# Patient Record
Sex: Female | Born: 1945 | Race: White | Hispanic: No | Marital: Married | Smoking: Never smoker
Health system: Southern US, Community
[De-identification: ages and names within clinical notes are randomized; demographics above are authoritative.]

## PROBLEM LIST (undated history)

## (undated) DIAGNOSIS — E7800 Pure hypercholesterolemia, unspecified: Secondary | ICD-10-CM

## (undated) DIAGNOSIS — M810 Age-related osteoporosis without current pathological fracture: Secondary | ICD-10-CM

## (undated) DIAGNOSIS — K219 Gastro-esophageal reflux disease without esophagitis: Secondary | ICD-10-CM

## (undated) HISTORY — DX: Age-related osteoporosis without current pathological fracture: M81.0

## (undated) HISTORY — DX: Gastro-esophageal reflux disease without esophagitis: K21.9

## (undated) HISTORY — DX: Pure hypercholesterolemia, unspecified: E78.00

---

## 1995-10-04 HISTORY — PX: INGUINAL HERNIA REPAIR: SHX194

## 1998-07-14 ENCOUNTER — Ambulatory Visit (HOSPITAL_COMMUNITY): Admission: RE | Admit: 1998-07-14 | Discharge: 1998-07-14 | Payer: Self-pay | Admitting: Obstetrics and Gynecology

## 1998-10-03 HISTORY — PX: CHOLECYSTECTOMY: SHX55

## 1999-02-17 ENCOUNTER — Other Ambulatory Visit: Admission: RE | Admit: 1999-02-17 | Discharge: 1999-02-17 | Payer: Self-pay | Admitting: Obstetrics and Gynecology

## 1999-07-13 ENCOUNTER — Ambulatory Visit (HOSPITAL_COMMUNITY): Admission: RE | Admit: 1999-07-13 | Discharge: 1999-07-13 | Payer: Self-pay | Admitting: Obstetrics and Gynecology

## 1999-07-13 ENCOUNTER — Encounter: Payer: Self-pay | Admitting: Obstetrics and Gynecology

## 1999-07-20 ENCOUNTER — Encounter: Payer: Self-pay | Admitting: Gastroenterology

## 1999-07-20 ENCOUNTER — Ambulatory Visit (HOSPITAL_COMMUNITY): Admission: RE | Admit: 1999-07-20 | Discharge: 1999-07-20 | Payer: Self-pay | Admitting: Gastroenterology

## 1999-07-27 ENCOUNTER — Other Ambulatory Visit: Admission: RE | Admit: 1999-07-27 | Discharge: 1999-07-27 | Payer: Self-pay | Admitting: Gastroenterology

## 1999-08-27 ENCOUNTER — Encounter: Payer: Self-pay | Admitting: Gastroenterology

## 1999-08-27 ENCOUNTER — Ambulatory Visit (HOSPITAL_COMMUNITY): Admission: RE | Admit: 1999-08-27 | Discharge: 1999-08-27 | Payer: Self-pay | Admitting: Gastroenterology

## 1999-09-30 ENCOUNTER — Encounter: Payer: Self-pay | Admitting: Surgery

## 1999-10-01 ENCOUNTER — Encounter: Payer: Self-pay | Admitting: Surgery

## 1999-10-01 ENCOUNTER — Ambulatory Visit (HOSPITAL_COMMUNITY): Admission: RE | Admit: 1999-10-01 | Discharge: 1999-10-02 | Payer: Self-pay | Admitting: Surgery

## 2000-03-14 ENCOUNTER — Other Ambulatory Visit: Admission: RE | Admit: 2000-03-14 | Discharge: 2000-03-14 | Payer: Self-pay | Admitting: Obstetrics and Gynecology

## 2000-07-11 ENCOUNTER — Ambulatory Visit (HOSPITAL_COMMUNITY): Admission: RE | Admit: 2000-07-11 | Discharge: 2000-07-11 | Payer: Self-pay | Admitting: Obstetrics and Gynecology

## 2000-07-11 ENCOUNTER — Encounter: Payer: Self-pay | Admitting: Obstetrics and Gynecology

## 2001-03-21 ENCOUNTER — Other Ambulatory Visit: Admission: RE | Admit: 2001-03-21 | Discharge: 2001-03-21 | Payer: Self-pay | Admitting: Obstetrics and Gynecology

## 2001-07-17 ENCOUNTER — Ambulatory Visit (HOSPITAL_COMMUNITY): Admission: RE | Admit: 2001-07-17 | Discharge: 2001-07-17 | Payer: Self-pay | Admitting: Obstetrics and Gynecology

## 2001-07-17 ENCOUNTER — Encounter: Payer: Self-pay | Admitting: Obstetrics and Gynecology

## 2002-08-20 ENCOUNTER — Ambulatory Visit (HOSPITAL_COMMUNITY): Admission: RE | Admit: 2002-08-20 | Discharge: 2002-08-20 | Payer: Self-pay | Admitting: Obstetrics and Gynecology

## 2002-08-20 ENCOUNTER — Encounter: Payer: Self-pay | Admitting: Obstetrics and Gynecology

## 2002-12-11 ENCOUNTER — Other Ambulatory Visit: Admission: RE | Admit: 2002-12-11 | Discharge: 2002-12-11 | Payer: Self-pay | Admitting: Obstetrics and Gynecology

## 2003-09-02 ENCOUNTER — Ambulatory Visit (HOSPITAL_COMMUNITY): Admission: RE | Admit: 2003-09-02 | Discharge: 2003-09-02 | Payer: Self-pay | Admitting: Obstetrics and Gynecology

## 2003-10-07 ENCOUNTER — Encounter: Admission: RE | Admit: 2003-10-07 | Discharge: 2003-10-07 | Payer: Self-pay | Admitting: Obstetrics and Gynecology

## 2003-12-23 ENCOUNTER — Other Ambulatory Visit: Admission: RE | Admit: 2003-12-23 | Discharge: 2003-12-23 | Payer: Self-pay | Admitting: Obstetrics and Gynecology

## 2004-12-23 ENCOUNTER — Other Ambulatory Visit: Admission: RE | Admit: 2004-12-23 | Discharge: 2004-12-23 | Payer: Self-pay | Admitting: Obstetrics and Gynecology

## 2005-10-03 HISTORY — PX: CATARACT EXTRACTION: SUR2

## 2006-02-01 ENCOUNTER — Encounter: Payer: Self-pay | Admitting: Obstetrics and Gynecology

## 2010-10-24 ENCOUNTER — Encounter: Payer: Self-pay | Admitting: Obstetrics and Gynecology

## 2011-08-31 ENCOUNTER — Other Ambulatory Visit: Payer: Self-pay | Admitting: Obstetrics and Gynecology

## 2013-06-13 ENCOUNTER — Encounter: Payer: Self-pay | Admitting: Gastroenterology

## 2013-10-11 ENCOUNTER — Other Ambulatory Visit: Payer: Self-pay | Admitting: Obstetrics and Gynecology

## 2015-11-03 DIAGNOSIS — Z23 Encounter for immunization: Secondary | ICD-10-CM | POA: Diagnosis not present

## 2015-11-03 DIAGNOSIS — Z9181 History of falling: Secondary | ICD-10-CM | POA: Diagnosis not present

## 2015-11-03 DIAGNOSIS — Z658 Other specified problems related to psychosocial circumstances: Secondary | ICD-10-CM | POA: Diagnosis not present

## 2015-11-03 DIAGNOSIS — E785 Hyperlipidemia, unspecified: Secondary | ICD-10-CM | POA: Diagnosis not present

## 2015-11-03 DIAGNOSIS — Z0000 Encounter for general adult medical examination without abnormal findings: Secondary | ICD-10-CM | POA: Diagnosis not present

## 2015-11-03 DIAGNOSIS — K219 Gastro-esophageal reflux disease without esophagitis: Secondary | ICD-10-CM | POA: Diagnosis not present

## 2015-11-03 DIAGNOSIS — Z79899 Other long term (current) drug therapy: Secondary | ICD-10-CM | POA: Diagnosis not present

## 2015-11-03 DIAGNOSIS — M810 Age-related osteoporosis without current pathological fracture: Secondary | ICD-10-CM | POA: Diagnosis not present

## 2015-11-03 DIAGNOSIS — Z1389 Encounter for screening for other disorder: Secondary | ICD-10-CM | POA: Diagnosis not present

## 2015-11-10 DIAGNOSIS — Z1231 Encounter for screening mammogram for malignant neoplasm of breast: Secondary | ICD-10-CM | POA: Diagnosis not present

## 2015-11-10 DIAGNOSIS — Z01419 Encounter for gynecological examination (general) (routine) without abnormal findings: Secondary | ICD-10-CM | POA: Diagnosis not present

## 2015-12-29 DIAGNOSIS — C4491 Basal cell carcinoma of skin, unspecified: Secondary | ICD-10-CM | POA: Diagnosis not present

## 2016-05-03 DIAGNOSIS — H40013 Open angle with borderline findings, low risk, bilateral: Secondary | ICD-10-CM | POA: Diagnosis not present

## 2016-07-01 DIAGNOSIS — Z23 Encounter for immunization: Secondary | ICD-10-CM | POA: Diagnosis not present

## 2016-11-07 DIAGNOSIS — L820 Inflamed seborrheic keratosis: Secondary | ICD-10-CM | POA: Diagnosis not present

## 2016-11-07 DIAGNOSIS — C44529 Squamous cell carcinoma of skin of other part of trunk: Secondary | ICD-10-CM | POA: Diagnosis not present

## 2016-11-07 DIAGNOSIS — D225 Melanocytic nevi of trunk: Secondary | ICD-10-CM | POA: Diagnosis not present

## 2016-11-07 DIAGNOSIS — D1801 Hemangioma of skin and subcutaneous tissue: Secondary | ICD-10-CM | POA: Diagnosis not present

## 2016-11-08 DIAGNOSIS — Z9181 History of falling: Secondary | ICD-10-CM | POA: Diagnosis not present

## 2016-11-08 DIAGNOSIS — Z23 Encounter for immunization: Secondary | ICD-10-CM | POA: Diagnosis not present

## 2016-11-08 DIAGNOSIS — Z658 Other specified problems related to psychosocial circumstances: Secondary | ICD-10-CM | POA: Diagnosis not present

## 2016-11-08 DIAGNOSIS — Z1389 Encounter for screening for other disorder: Secondary | ICD-10-CM | POA: Diagnosis not present

## 2016-11-08 DIAGNOSIS — Z79899 Other long term (current) drug therapy: Secondary | ICD-10-CM | POA: Diagnosis not present

## 2016-11-08 DIAGNOSIS — M810 Age-related osteoporosis without current pathological fracture: Secondary | ICD-10-CM | POA: Diagnosis not present

## 2016-11-08 DIAGNOSIS — S61219A Laceration without foreign body of unspecified finger without damage to nail, initial encounter: Secondary | ICD-10-CM | POA: Diagnosis not present

## 2016-11-08 DIAGNOSIS — Z0000 Encounter for general adult medical examination without abnormal findings: Secondary | ICD-10-CM | POA: Diagnosis not present

## 2016-11-08 DIAGNOSIS — E7800 Pure hypercholesterolemia, unspecified: Secondary | ICD-10-CM | POA: Diagnosis not present

## 2016-11-08 DIAGNOSIS — E785 Hyperlipidemia, unspecified: Secondary | ICD-10-CM | POA: Diagnosis not present

## 2016-11-08 DIAGNOSIS — K219 Gastro-esophageal reflux disease without esophagitis: Secondary | ICD-10-CM | POA: Diagnosis not present

## 2016-11-14 DIAGNOSIS — Z1231 Encounter for screening mammogram for malignant neoplasm of breast: Secondary | ICD-10-CM | POA: Diagnosis not present

## 2016-11-14 DIAGNOSIS — Z01419 Encounter for gynecological examination (general) (routine) without abnormal findings: Secondary | ICD-10-CM | POA: Diagnosis not present

## 2016-11-14 DIAGNOSIS — N39498 Other specified urinary incontinence: Secondary | ICD-10-CM | POA: Diagnosis not present

## 2016-12-05 DIAGNOSIS — L578 Other skin changes due to chronic exposure to nonionizing radiation: Secondary | ICD-10-CM | POA: Diagnosis not present

## 2016-12-05 DIAGNOSIS — L820 Inflamed seborrheic keratosis: Secondary | ICD-10-CM | POA: Diagnosis not present

## 2016-12-05 DIAGNOSIS — L728 Other follicular cysts of the skin and subcutaneous tissue: Secondary | ICD-10-CM | POA: Diagnosis not present

## 2017-01-23 DIAGNOSIS — L570 Actinic keratosis: Secondary | ICD-10-CM | POA: Diagnosis not present

## 2017-01-23 DIAGNOSIS — L821 Other seborrheic keratosis: Secondary | ICD-10-CM | POA: Diagnosis not present

## 2017-02-21 DIAGNOSIS — H40013 Open angle with borderline findings, low risk, bilateral: Secondary | ICD-10-CM | POA: Diagnosis not present

## 2017-07-25 DIAGNOSIS — Z23 Encounter for immunization: Secondary | ICD-10-CM | POA: Diagnosis not present

## 2017-07-31 DIAGNOSIS — D1801 Hemangioma of skin and subcutaneous tissue: Secondary | ICD-10-CM | POA: Diagnosis not present

## 2017-07-31 DIAGNOSIS — L820 Inflamed seborrheic keratosis: Secondary | ICD-10-CM | POA: Diagnosis not present

## 2017-11-10 DIAGNOSIS — Z658 Other specified problems related to psychosocial circumstances: Secondary | ICD-10-CM | POA: Diagnosis not present

## 2017-11-10 DIAGNOSIS — Z9181 History of falling: Secondary | ICD-10-CM | POA: Diagnosis not present

## 2017-11-10 DIAGNOSIS — Z1339 Encounter for screening examination for other mental health and behavioral disorders: Secondary | ICD-10-CM | POA: Diagnosis not present

## 2017-11-10 DIAGNOSIS — Z0000 Encounter for general adult medical examination without abnormal findings: Secondary | ICD-10-CM | POA: Diagnosis not present

## 2017-11-10 DIAGNOSIS — M810 Age-related osteoporosis without current pathological fracture: Secondary | ICD-10-CM | POA: Diagnosis not present

## 2017-11-10 DIAGNOSIS — Z79899 Other long term (current) drug therapy: Secondary | ICD-10-CM | POA: Diagnosis not present

## 2017-11-10 DIAGNOSIS — E785 Hyperlipidemia, unspecified: Secondary | ICD-10-CM | POA: Diagnosis not present

## 2017-11-10 DIAGNOSIS — Z6826 Body mass index (BMI) 26.0-26.9, adult: Secondary | ICD-10-CM | POA: Diagnosis not present

## 2017-11-10 DIAGNOSIS — Z1331 Encounter for screening for depression: Secondary | ICD-10-CM | POA: Diagnosis not present

## 2017-11-10 DIAGNOSIS — K219 Gastro-esophageal reflux disease without esophagitis: Secondary | ICD-10-CM | POA: Diagnosis not present

## 2017-11-22 DIAGNOSIS — Z1231 Encounter for screening mammogram for malignant neoplasm of breast: Secondary | ICD-10-CM | POA: Diagnosis not present

## 2017-11-22 DIAGNOSIS — Z6826 Body mass index (BMI) 26.0-26.9, adult: Secondary | ICD-10-CM | POA: Diagnosis not present

## 2017-11-22 DIAGNOSIS — Z01419 Encounter for gynecological examination (general) (routine) without abnormal findings: Secondary | ICD-10-CM | POA: Diagnosis not present

## 2017-11-24 ENCOUNTER — Other Ambulatory Visit: Payer: Self-pay | Admitting: Obstetrics and Gynecology

## 2017-11-24 DIAGNOSIS — R928 Other abnormal and inconclusive findings on diagnostic imaging of breast: Secondary | ICD-10-CM

## 2017-11-27 ENCOUNTER — Ambulatory Visit: Payer: Self-pay

## 2017-11-27 ENCOUNTER — Ambulatory Visit
Admission: RE | Admit: 2017-11-27 | Discharge: 2017-11-27 | Disposition: A | Discharge status: Home / Self Care | Payer: PPO | Source: Ambulatory Visit | Attending: Obstetrics and Gynecology | Admitting: Obstetrics and Gynecology

## 2017-11-27 DIAGNOSIS — R928 Other abnormal and inconclusive findings on diagnostic imaging of breast: Secondary | ICD-10-CM

## 2017-11-27 DIAGNOSIS — R922 Inconclusive mammogram: Secondary | ICD-10-CM | POA: Diagnosis not present

## 2017-11-28 DIAGNOSIS — M810 Age-related osteoporosis without current pathological fracture: Secondary | ICD-10-CM | POA: Diagnosis not present

## 2017-12-06 DIAGNOSIS — D225 Melanocytic nevi of trunk: Secondary | ICD-10-CM | POA: Diagnosis not present

## 2017-12-06 DIAGNOSIS — L739 Follicular disorder, unspecified: Secondary | ICD-10-CM | POA: Diagnosis not present

## 2017-12-06 DIAGNOSIS — L578 Other skin changes due to chronic exposure to nonionizing radiation: Secondary | ICD-10-CM | POA: Diagnosis not present

## 2017-12-06 DIAGNOSIS — L820 Inflamed seborrheic keratosis: Secondary | ICD-10-CM | POA: Diagnosis not present

## 2017-12-13 DIAGNOSIS — L309 Dermatitis, unspecified: Secondary | ICD-10-CM | POA: Diagnosis not present

## 2017-12-25 DIAGNOSIS — H40013 Open angle with borderline findings, low risk, bilateral: Secondary | ICD-10-CM | POA: Diagnosis not present

## 2018-05-21 DIAGNOSIS — R0982 Postnasal drip: Secondary | ICD-10-CM | POA: Diagnosis not present

## 2018-05-21 DIAGNOSIS — Z6826 Body mass index (BMI) 26.0-26.9, adult: Secondary | ICD-10-CM | POA: Diagnosis not present

## 2018-06-01 DIAGNOSIS — M810 Age-related osteoporosis without current pathological fracture: Secondary | ICD-10-CM | POA: Diagnosis not present

## 2018-07-04 DIAGNOSIS — Z23 Encounter for immunization: Secondary | ICD-10-CM | POA: Diagnosis not present

## 2018-08-08 DIAGNOSIS — K219 Gastro-esophageal reflux disease without esophagitis: Secondary | ICD-10-CM | POA: Diagnosis not present

## 2018-08-08 DIAGNOSIS — R1310 Dysphagia, unspecified: Secondary | ICD-10-CM | POA: Diagnosis not present

## 2018-08-14 DIAGNOSIS — K2950 Unspecified chronic gastritis without bleeding: Secondary | ICD-10-CM | POA: Diagnosis not present

## 2018-08-14 DIAGNOSIS — Z79899 Other long term (current) drug therapy: Secondary | ICD-10-CM | POA: Diagnosis not present

## 2018-08-14 DIAGNOSIS — K644 Residual hemorrhoidal skin tags: Secondary | ICD-10-CM | POA: Diagnosis not present

## 2018-08-14 DIAGNOSIS — K449 Diaphragmatic hernia without obstruction or gangrene: Secondary | ICD-10-CM | POA: Diagnosis not present

## 2018-08-14 DIAGNOSIS — Z7982 Long term (current) use of aspirin: Secondary | ICD-10-CM | POA: Diagnosis not present

## 2018-08-14 DIAGNOSIS — K2970 Gastritis, unspecified, without bleeding: Secondary | ICD-10-CM | POA: Diagnosis not present

## 2018-08-14 DIAGNOSIS — Z800 Family history of malignant neoplasm of digestive organs: Secondary | ICD-10-CM | POA: Diagnosis not present

## 2018-08-14 DIAGNOSIS — I341 Nonrheumatic mitral (valve) prolapse: Secondary | ICD-10-CM | POA: Diagnosis not present

## 2018-08-14 DIAGNOSIS — K219 Gastro-esophageal reflux disease without esophagitis: Secondary | ICD-10-CM | POA: Diagnosis not present

## 2018-08-14 DIAGNOSIS — R1310 Dysphagia, unspecified: Secondary | ICD-10-CM | POA: Diagnosis not present

## 2018-08-14 DIAGNOSIS — M810 Age-related osteoporosis without current pathological fracture: Secondary | ICD-10-CM | POA: Diagnosis not present

## 2018-08-14 DIAGNOSIS — Z1211 Encounter for screening for malignant neoplasm of colon: Secondary | ICD-10-CM | POA: Diagnosis not present

## 2018-08-14 DIAGNOSIS — K319 Disease of stomach and duodenum, unspecified: Secondary | ICD-10-CM | POA: Diagnosis not present

## 2018-11-05 DIAGNOSIS — H40013 Open angle with borderline findings, low risk, bilateral: Secondary | ICD-10-CM | POA: Diagnosis not present

## 2018-11-13 DIAGNOSIS — K219 Gastro-esophageal reflux disease without esophagitis: Secondary | ICD-10-CM | POA: Diagnosis not present

## 2018-11-13 DIAGNOSIS — Z6826 Body mass index (BMI) 26.0-26.9, adult: Secondary | ICD-10-CM | POA: Diagnosis not present

## 2018-11-13 DIAGNOSIS — E785 Hyperlipidemia, unspecified: Secondary | ICD-10-CM | POA: Diagnosis not present

## 2018-11-13 DIAGNOSIS — Z79899 Other long term (current) drug therapy: Secondary | ICD-10-CM | POA: Diagnosis not present

## 2018-11-13 DIAGNOSIS — Z658 Other specified problems related to psychosocial circumstances: Secondary | ICD-10-CM | POA: Diagnosis not present

## 2018-11-13 DIAGNOSIS — Z0000 Encounter for general adult medical examination without abnormal findings: Secondary | ICD-10-CM | POA: Diagnosis not present

## 2018-11-29 DIAGNOSIS — Z1231 Encounter for screening mammogram for malignant neoplasm of breast: Secondary | ICD-10-CM | POA: Diagnosis not present

## 2019-02-27 IMAGING — MG DIGITAL DIAGNOSTIC UNILATERAL RIGHT MAMMOGRAM WITH TOMO AND CAD
4 series · 4 of 12 positions shown · non-contrast
Comparison: Previous exam(s).

CLINICAL DATA: Right breast asymmetry seen on most recent screening
mammography.

EXAM:
DIGITAL DIAGNOSTIC UNILATERAL RIGHT MAMMOGRAM WITH CAD AND TOMO

[R ML synth-2D]
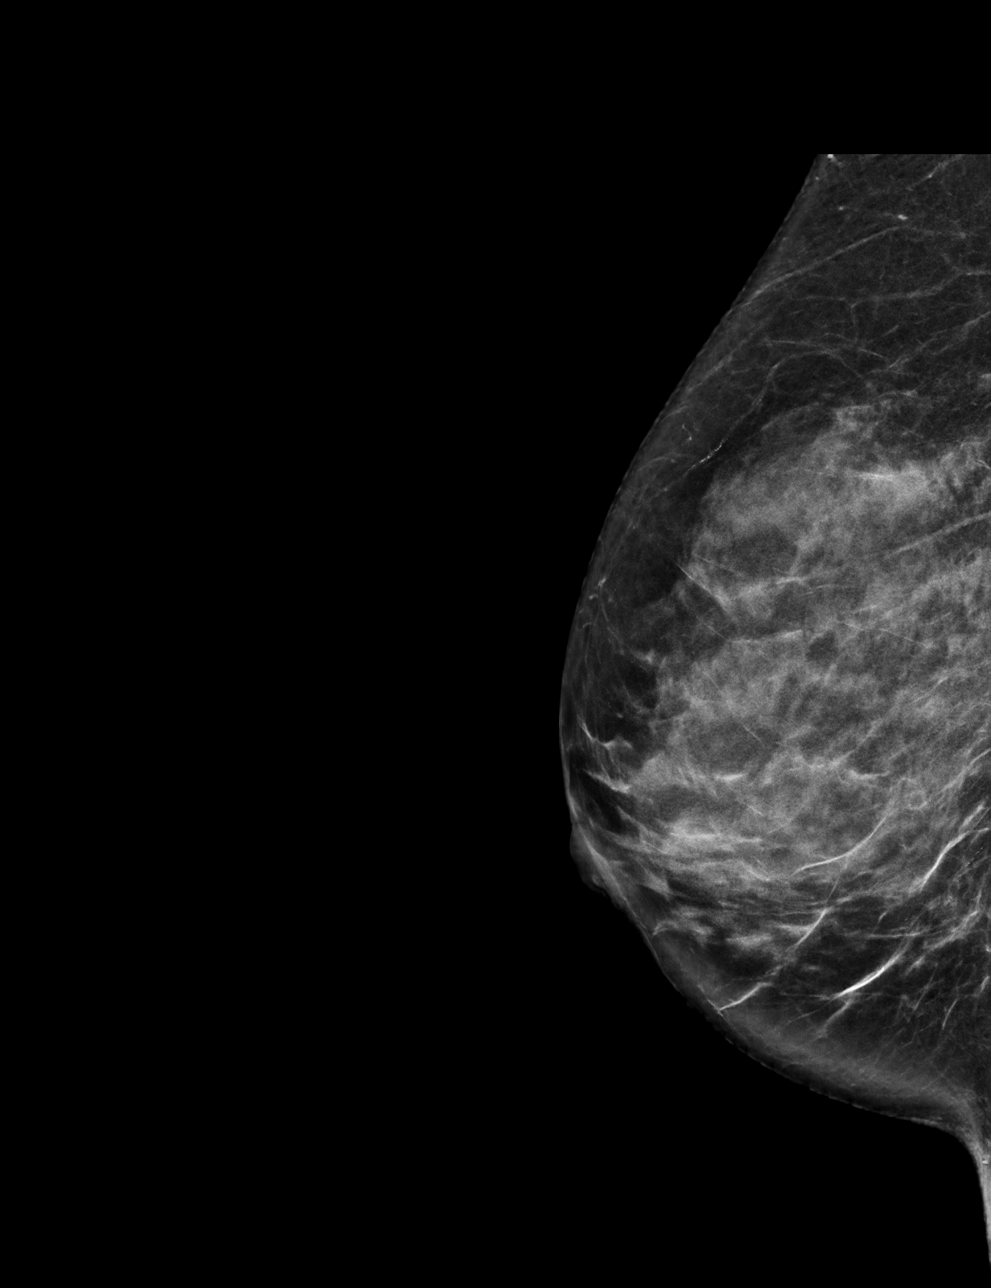

[R CC synth-2D]
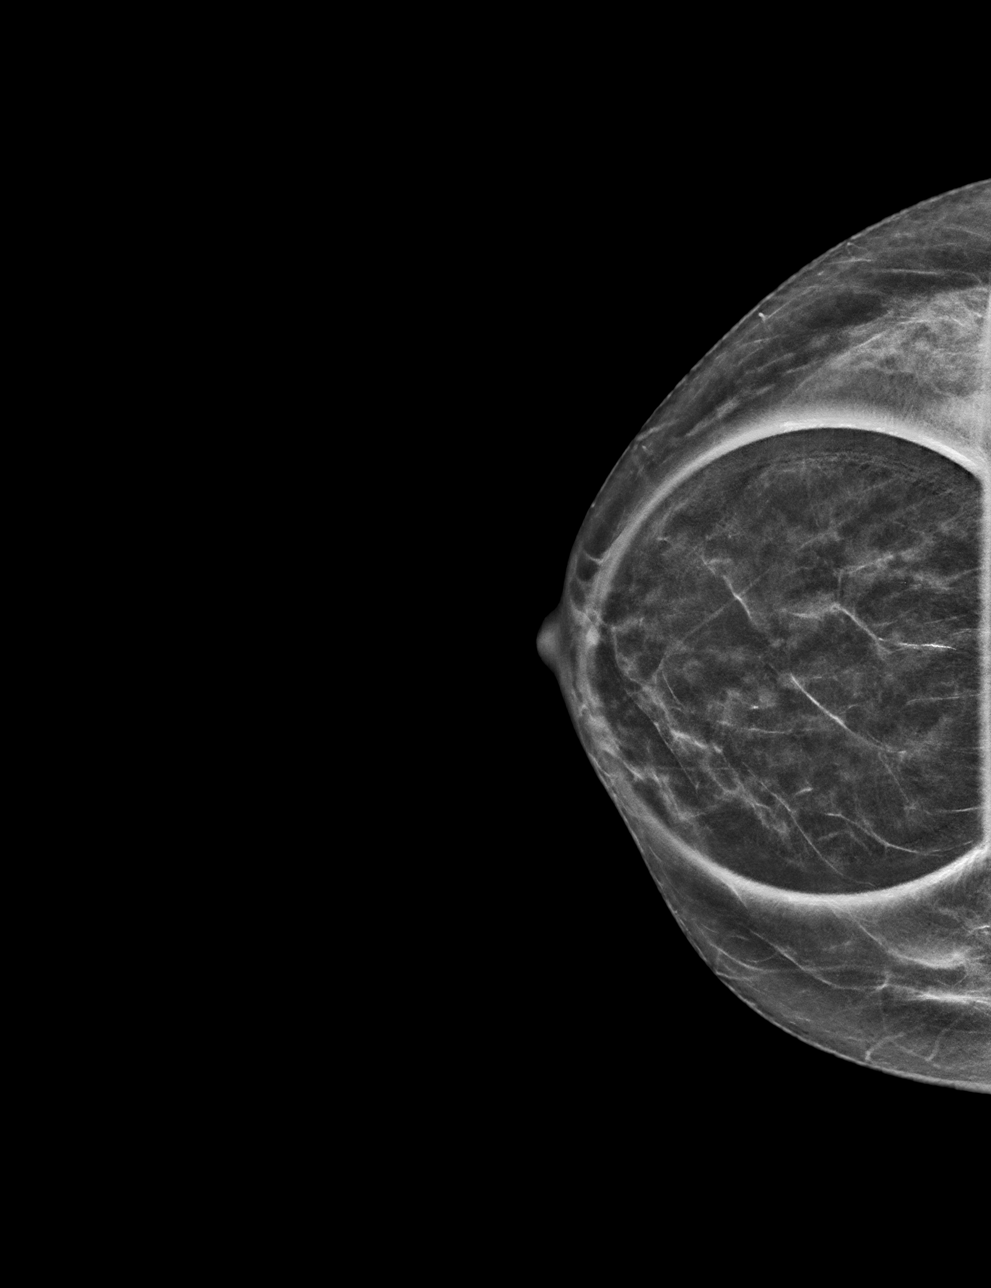

[R ML tomo · tomo slice 33/64.0]
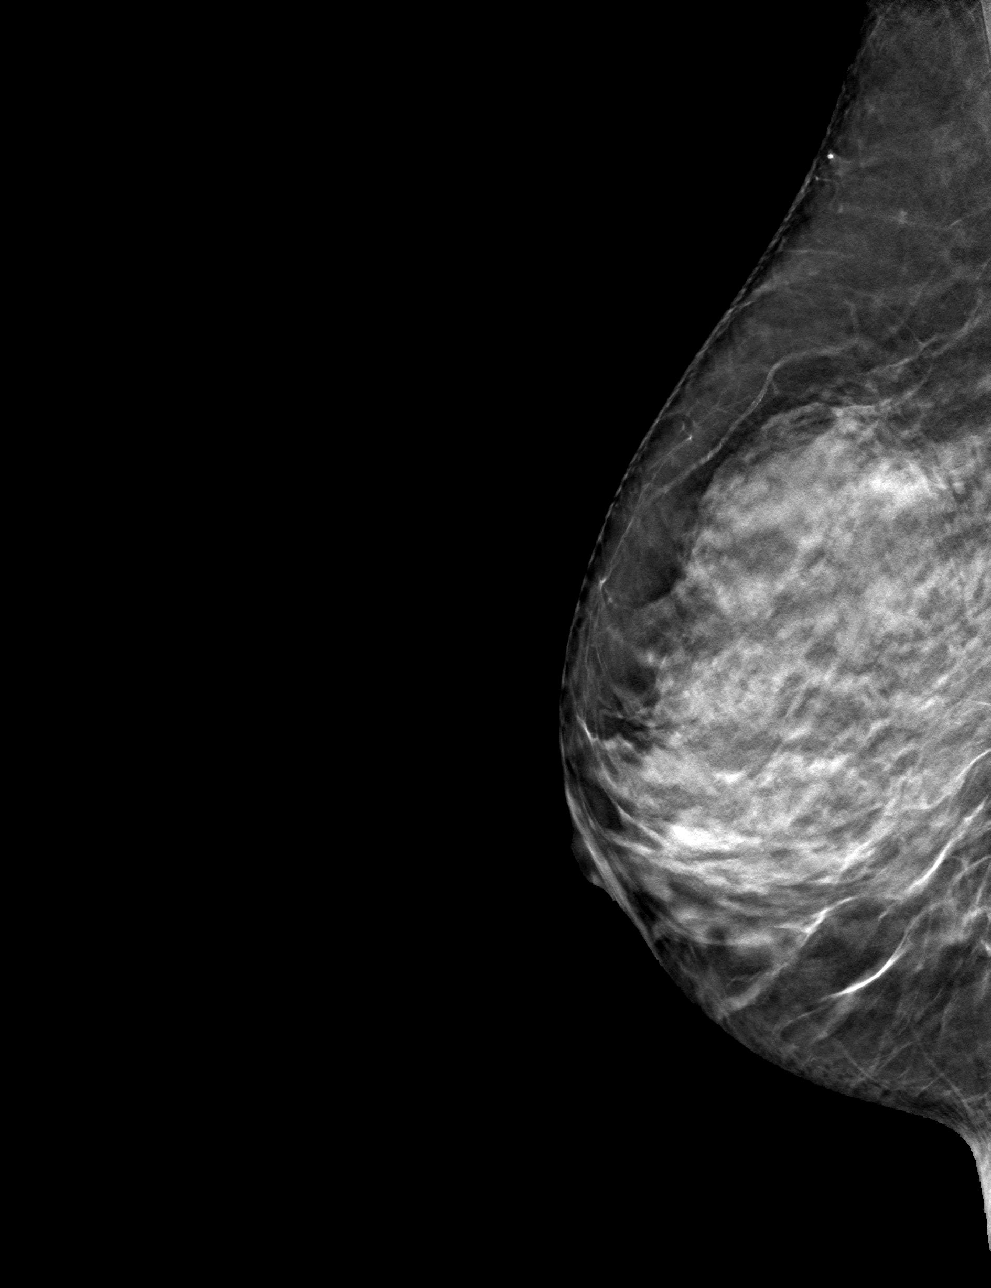

[R CC tomo · tomo slice 25/48.0]
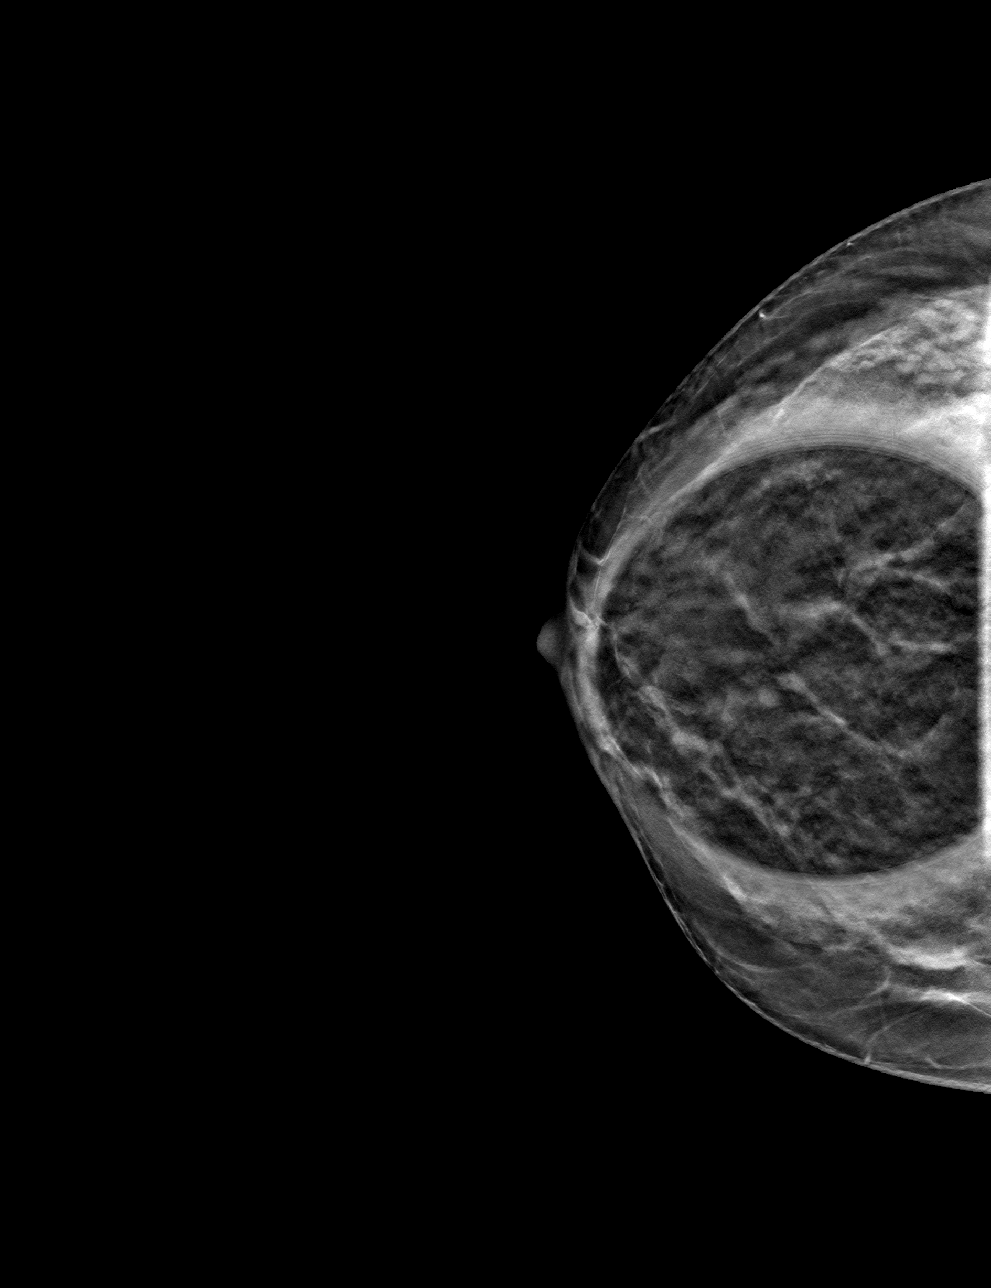

[4 of 12 positions shown; findings below may reference images not displayed]

ACR Breast Density Category c: The breast tissue is heterogeneously
dense, which may obscure small masses.
FINDINGS: Additional mammographic views of the right breast demonstrate no
suspicious masses or areas of architectural distortion. Previously
seen central right breast asymmetry effaces to glandular tissue.

Mammographic images were processed with CAD.
IMPRESSION: No mammographic evidence of malignancy in the right breast.

RECOMMENDATION:
Screening mammogram in one year.(Code:[id number])

I have discussed the findings and recommendations with the patient.
Results were also provided in writing at the conclusion of the
visit. If applicable, a reminder letter will be sent to the patient
regarding the next appointment.

BI-RADS CATEGORY  1: Negative.

## 2019-04-30 DIAGNOSIS — B029 Zoster without complications: Secondary | ICD-10-CM | POA: Diagnosis not present

## 2019-04-30 DIAGNOSIS — L299 Pruritus, unspecified: Secondary | ICD-10-CM | POA: Diagnosis not present

## 2019-04-30 DIAGNOSIS — L309 Dermatitis, unspecified: Secondary | ICD-10-CM | POA: Diagnosis not present

## 2019-05-01 ENCOUNTER — Other Ambulatory Visit: Payer: Self-pay

## 2019-07-16 DIAGNOSIS — Z23 Encounter for immunization: Secondary | ICD-10-CM | POA: Diagnosis not present

## 2019-11-12 DIAGNOSIS — L821 Other seborrheic keratosis: Secondary | ICD-10-CM | POA: Diagnosis not present

## 2019-11-12 DIAGNOSIS — L650 Telogen effluvium: Secondary | ICD-10-CM | POA: Diagnosis not present

## 2019-11-19 DIAGNOSIS — Z658 Other specified problems related to psychosocial circumstances: Secondary | ICD-10-CM | POA: Diagnosis not present

## 2019-11-19 DIAGNOSIS — Z0000 Encounter for general adult medical examination without abnormal findings: Secondary | ICD-10-CM | POA: Diagnosis not present

## 2019-11-19 DIAGNOSIS — Z1331 Encounter for screening for depression: Secondary | ICD-10-CM | POA: Diagnosis not present

## 2019-11-19 DIAGNOSIS — Z9181 History of falling: Secondary | ICD-10-CM | POA: Diagnosis not present

## 2019-11-19 DIAGNOSIS — K219 Gastro-esophageal reflux disease without esophagitis: Secondary | ICD-10-CM | POA: Diagnosis not present

## 2019-11-19 DIAGNOSIS — E7800 Pure hypercholesterolemia, unspecified: Secondary | ICD-10-CM | POA: Diagnosis not present

## 2019-11-19 DIAGNOSIS — Z79899 Other long term (current) drug therapy: Secondary | ICD-10-CM | POA: Diagnosis not present

## 2019-11-19 DIAGNOSIS — Z6827 Body mass index (BMI) 27.0-27.9, adult: Secondary | ICD-10-CM | POA: Diagnosis not present

## 2019-12-26 DIAGNOSIS — H40013 Open angle with borderline findings, low risk, bilateral: Secondary | ICD-10-CM | POA: Diagnosis not present

## 2020-01-14 DIAGNOSIS — M722 Plantar fascial fibromatosis: Secondary | ICD-10-CM

## 2020-01-14 DIAGNOSIS — M6701 Short Achilles tendon (acquired), right ankle: Secondary | ICD-10-CM

## 2020-01-14 DIAGNOSIS — M7731 Calcaneal spur, right foot: Secondary | ICD-10-CM | POA: Diagnosis not present

## 2020-01-14 HISTORY — DX: Plantar fascial fibromatosis: M72.2

## 2020-01-14 HISTORY — DX: Short Achilles tendon (acquired), right ankle: M67.01

## 2020-02-13 DIAGNOSIS — M6701 Short Achilles tendon (acquired), right ankle: Secondary | ICD-10-CM | POA: Diagnosis not present

## 2020-02-13 DIAGNOSIS — M722 Plantar fascial fibromatosis: Secondary | ICD-10-CM | POA: Diagnosis not present

## 2020-03-11 DIAGNOSIS — L820 Inflamed seborrheic keratosis: Secondary | ICD-10-CM | POA: Diagnosis not present

## 2020-03-11 DIAGNOSIS — L650 Telogen effluvium: Secondary | ICD-10-CM | POA: Diagnosis not present

## 2020-07-24 DIAGNOSIS — Z23 Encounter for immunization: Secondary | ICD-10-CM | POA: Diagnosis not present

## 2020-09-10 DIAGNOSIS — L739 Follicular disorder, unspecified: Secondary | ICD-10-CM | POA: Diagnosis not present

## 2020-09-10 DIAGNOSIS — D225 Melanocytic nevi of trunk: Secondary | ICD-10-CM | POA: Diagnosis not present

## 2020-09-10 DIAGNOSIS — L918 Other hypertrophic disorders of the skin: Secondary | ICD-10-CM | POA: Diagnosis not present

## 2020-09-10 DIAGNOSIS — L814 Other melanin hyperpigmentation: Secondary | ICD-10-CM | POA: Diagnosis not present

## 2020-09-15 DIAGNOSIS — Z01419 Encounter for gynecological examination (general) (routine) without abnormal findings: Secondary | ICD-10-CM | POA: Diagnosis not present

## 2020-09-15 DIAGNOSIS — Z1231 Encounter for screening mammogram for malignant neoplasm of breast: Secondary | ICD-10-CM | POA: Diagnosis not present

## 2020-11-23 DIAGNOSIS — Z0000 Encounter for general adult medical examination without abnormal findings: Secondary | ICD-10-CM | POA: Diagnosis not present

## 2020-11-23 DIAGNOSIS — Z79899 Other long term (current) drug therapy: Secondary | ICD-10-CM | POA: Diagnosis not present

## 2020-11-23 DIAGNOSIS — K219 Gastro-esophageal reflux disease without esophagitis: Secondary | ICD-10-CM | POA: Diagnosis not present

## 2020-11-23 DIAGNOSIS — Z9181 History of falling: Secondary | ICD-10-CM | POA: Diagnosis not present

## 2020-11-23 DIAGNOSIS — E7800 Pure hypercholesterolemia, unspecified: Secondary | ICD-10-CM | POA: Diagnosis not present

## 2020-11-23 DIAGNOSIS — Z658 Other specified problems related to psychosocial circumstances: Secondary | ICD-10-CM | POA: Diagnosis not present

## 2020-11-23 DIAGNOSIS — M810 Age-related osteoporosis without current pathological fracture: Secondary | ICD-10-CM | POA: Diagnosis not present

## 2020-11-23 DIAGNOSIS — Z6827 Body mass index (BMI) 27.0-27.9, adult: Secondary | ICD-10-CM | POA: Diagnosis not present

## 2020-11-23 DIAGNOSIS — Z1331 Encounter for screening for depression: Secondary | ICD-10-CM | POA: Diagnosis not present

## 2020-12-07 DIAGNOSIS — H40013 Open angle with borderline findings, low risk, bilateral: Secondary | ICD-10-CM | POA: Diagnosis not present

## 2021-02-05 DIAGNOSIS — H18513 Endothelial corneal dystrophy, bilateral: Secondary | ICD-10-CM | POA: Diagnosis not present

## 2021-02-25 DIAGNOSIS — M810 Age-related osteoporosis without current pathological fracture: Secondary | ICD-10-CM | POA: Diagnosis not present

## 2021-05-14 DIAGNOSIS — Z01818 Encounter for other preprocedural examination: Secondary | ICD-10-CM | POA: Diagnosis not present

## 2021-05-14 DIAGNOSIS — H18513 Endothelial corneal dystrophy, bilateral: Secondary | ICD-10-CM | POA: Diagnosis not present

## 2021-05-31 DIAGNOSIS — Z961 Presence of intraocular lens: Secondary | ICD-10-CM | POA: Diagnosis not present

## 2021-05-31 DIAGNOSIS — H18512 Endothelial corneal dystrophy, left eye: Secondary | ICD-10-CM | POA: Diagnosis not present

## 2021-07-26 DIAGNOSIS — H18511 Endothelial corneal dystrophy, right eye: Secondary | ICD-10-CM | POA: Diagnosis not present

## 2021-07-26 DIAGNOSIS — Z961 Presence of intraocular lens: Secondary | ICD-10-CM | POA: Diagnosis not present

## 2021-08-03 DIAGNOSIS — Z23 Encounter for immunization: Secondary | ICD-10-CM | POA: Diagnosis not present

## 2021-08-31 DIAGNOSIS — M810 Age-related osteoporosis without current pathological fracture: Secondary | ICD-10-CM | POA: Diagnosis not present

## 2021-09-14 DIAGNOSIS — D2239 Melanocytic nevi of other parts of face: Secondary | ICD-10-CM | POA: Diagnosis not present

## 2021-09-14 DIAGNOSIS — L821 Other seborrheic keratosis: Secondary | ICD-10-CM | POA: Diagnosis not present

## 2021-09-14 DIAGNOSIS — L650 Telogen effluvium: Secondary | ICD-10-CM | POA: Diagnosis not present

## 2021-09-14 DIAGNOSIS — D485 Neoplasm of uncertain behavior of skin: Secondary | ICD-10-CM | POA: Diagnosis not present

## 2021-09-14 DIAGNOSIS — D225 Melanocytic nevi of trunk: Secondary | ICD-10-CM | POA: Diagnosis not present

## 2021-11-29 DIAGNOSIS — I341 Nonrheumatic mitral (valve) prolapse: Secondary | ICD-10-CM | POA: Diagnosis not present

## 2021-11-29 DIAGNOSIS — K219 Gastro-esophageal reflux disease without esophagitis: Secondary | ICD-10-CM | POA: Diagnosis not present

## 2021-11-29 DIAGNOSIS — M810 Age-related osteoporosis without current pathological fracture: Secondary | ICD-10-CM | POA: Diagnosis not present

## 2021-11-29 DIAGNOSIS — Z0000 Encounter for general adult medical examination without abnormal findings: Secondary | ICD-10-CM | POA: Diagnosis not present

## 2021-11-29 DIAGNOSIS — E7800 Pure hypercholesterolemia, unspecified: Secondary | ICD-10-CM | POA: Diagnosis not present

## 2021-11-29 DIAGNOSIS — Z1331 Encounter for screening for depression: Secondary | ICD-10-CM | POA: Diagnosis not present

## 2021-11-29 DIAGNOSIS — Z6826 Body mass index (BMI) 26.0-26.9, adult: Secondary | ICD-10-CM | POA: Diagnosis not present

## 2021-11-29 DIAGNOSIS — Z658 Other specified problems related to psychosocial circumstances: Secondary | ICD-10-CM | POA: Diagnosis not present

## 2021-11-29 DIAGNOSIS — Z79899 Other long term (current) drug therapy: Secondary | ICD-10-CM | POA: Diagnosis not present

## 2021-12-01 DIAGNOSIS — S066X0A Traumatic subarachnoid hemorrhage without loss of consciousness, initial encounter: Secondary | ICD-10-CM | POA: Diagnosis not present

## 2021-12-01 DIAGNOSIS — S066X9A Traumatic subarachnoid hemorrhage with loss of consciousness of unspecified duration, initial encounter: Secondary | ICD-10-CM | POA: Diagnosis not present

## 2021-12-01 DIAGNOSIS — R918 Other nonspecific abnormal finding of lung field: Secondary | ICD-10-CM | POA: Diagnosis not present

## 2021-12-01 DIAGNOSIS — Z79899 Other long term (current) drug therapy: Secondary | ICD-10-CM | POA: Diagnosis not present

## 2021-12-01 DIAGNOSIS — W01198A Fall on same level from slipping, tripping and stumbling with subsequent striking against other object, initial encounter: Secondary | ICD-10-CM | POA: Diagnosis not present

## 2021-12-01 DIAGNOSIS — S299XXA Unspecified injury of thorax, initial encounter: Secondary | ICD-10-CM | POA: Diagnosis not present

## 2021-12-01 DIAGNOSIS — M50322 Other cervical disc degeneration at C5-C6 level: Secondary | ICD-10-CM | POA: Diagnosis not present

## 2021-12-01 DIAGNOSIS — K769 Liver disease, unspecified: Secondary | ICD-10-CM | POA: Diagnosis not present

## 2021-12-01 DIAGNOSIS — Z043 Encounter for examination and observation following other accident: Secondary | ICD-10-CM | POA: Diagnosis not present

## 2021-12-01 DIAGNOSIS — R609 Edema, unspecified: Secondary | ICD-10-CM | POA: Diagnosis not present

## 2021-12-01 DIAGNOSIS — N289 Disorder of kidney and ureter, unspecified: Secondary | ICD-10-CM | POA: Diagnosis not present

## 2021-12-01 DIAGNOSIS — M8580 Other specified disorders of bone density and structure, unspecified site: Secondary | ICD-10-CM | POA: Diagnosis not present

## 2021-12-01 DIAGNOSIS — Y999 Unspecified external cause status: Secondary | ICD-10-CM | POA: Diagnosis not present

## 2021-12-01 DIAGNOSIS — S060XAA Concussion with loss of consciousness status unknown, initial encounter: Secondary | ICD-10-CM | POA: Diagnosis not present

## 2021-12-01 DIAGNOSIS — S06360A Traumatic hemorrhage of cerebrum, unspecified, without loss of consciousness, initial encounter: Secondary | ICD-10-CM | POA: Diagnosis not present

## 2021-12-01 DIAGNOSIS — S065X9A Traumatic subdural hemorrhage with loss of consciousness of unspecified duration, initial encounter: Secondary | ICD-10-CM | POA: Diagnosis not present

## 2021-12-01 DIAGNOSIS — S199XXA Unspecified injury of neck, initial encounter: Secondary | ICD-10-CM | POA: Diagnosis not present

## 2021-12-01 DIAGNOSIS — S0232XA Fracture of orbital floor, left side, initial encounter for closed fracture: Secondary | ICD-10-CM | POA: Diagnosis not present

## 2021-12-01 DIAGNOSIS — Z7409 Other reduced mobility: Secondary | ICD-10-CM | POA: Diagnosis not present

## 2021-12-01 DIAGNOSIS — S065XAA Traumatic subdural hemorrhage with loss of consciousness status unknown, initial encounter: Secondary | ICD-10-CM | POA: Diagnosis not present

## 2021-12-01 DIAGNOSIS — S065X0A Traumatic subdural hemorrhage without loss of consciousness, initial encounter: Secondary | ICD-10-CM | POA: Diagnosis not present

## 2021-12-01 DIAGNOSIS — Z7982 Long term (current) use of aspirin: Secondary | ICD-10-CM | POA: Diagnosis not present

## 2021-12-01 DIAGNOSIS — W19XXXA Unspecified fall, initial encounter: Secondary | ICD-10-CM | POA: Diagnosis not present

## 2021-12-01 DIAGNOSIS — S0285XA Fracture of orbit, unspecified, initial encounter for closed fracture: Secondary | ICD-10-CM | POA: Diagnosis not present

## 2021-12-01 DIAGNOSIS — S0240DA Maxillary fracture, left side, initial encounter for closed fracture: Secondary | ICD-10-CM | POA: Diagnosis not present

## 2021-12-02 DIAGNOSIS — S066XAA Traumatic subarachnoid hemorrhage with loss of consciousness status unknown, initial encounter: Secondary | ICD-10-CM | POA: Diagnosis not present

## 2021-12-02 DIAGNOSIS — W19XXXA Unspecified fall, initial encounter: Secondary | ICD-10-CM | POA: Insufficient documentation

## 2021-12-02 DIAGNOSIS — H40052 Ocular hypertension, left eye: Secondary | ICD-10-CM | POA: Diagnosis not present

## 2021-12-02 DIAGNOSIS — S065XAA Traumatic subdural hemorrhage with loss of consciousness status unknown, initial encounter: Secondary | ICD-10-CM | POA: Diagnosis not present

## 2021-12-02 DIAGNOSIS — S0232XA Fracture of orbital floor, left side, initial encounter for closed fracture: Secondary | ICD-10-CM | POA: Diagnosis not present

## 2021-12-02 HISTORY — DX: Unspecified fall, initial encounter: W19.XXXA

## 2021-12-09 DIAGNOSIS — S02401A Maxillary fracture, unspecified, initial encounter for closed fracture: Secondary | ICD-10-CM | POA: Insufficient documentation

## 2021-12-09 DIAGNOSIS — S0240DD Maxillary fracture, left side, subsequent encounter for fracture with routine healing: Secondary | ICD-10-CM | POA: Diagnosis not present

## 2021-12-09 DIAGNOSIS — S0232XD Fracture of orbital floor, left side, subsequent encounter for fracture with routine healing: Secondary | ICD-10-CM | POA: Insufficient documentation

## 2021-12-09 DIAGNOSIS — S0012XA Contusion of left eyelid and periocular area, initial encounter: Secondary | ICD-10-CM | POA: Diagnosis not present

## 2021-12-09 HISTORY — DX: Maxillary fracture, unspecified side, initial encounter for closed fracture: S02.401A

## 2021-12-09 HISTORY — DX: Fracture of orbital floor, left side, subsequent encounter for fracture with routine healing: S02.32XD

## 2021-12-15 DIAGNOSIS — S0232XD Fracture of orbital floor, left side, subsequent encounter for fracture with routine healing: Secondary | ICD-10-CM | POA: Diagnosis not present

## 2021-12-15 DIAGNOSIS — Z6826 Body mass index (BMI) 26.0-26.9, adult: Secondary | ICD-10-CM | POA: Diagnosis not present

## 2021-12-15 DIAGNOSIS — R946 Abnormal results of thyroid function studies: Secondary | ICD-10-CM | POA: Diagnosis not present

## 2021-12-15 DIAGNOSIS — Z8679 Personal history of other diseases of the circulatory system: Secondary | ICD-10-CM | POA: Diagnosis not present

## 2021-12-15 DIAGNOSIS — S0012XD Contusion of left eyelid and periocular area, subsequent encounter: Secondary | ICD-10-CM | POA: Diagnosis not present

## 2021-12-15 DIAGNOSIS — S0219XD Other fracture of base of skull, subsequent encounter for fracture with routine healing: Secondary | ICD-10-CM | POA: Diagnosis not present

## 2021-12-15 DIAGNOSIS — R7989 Other specified abnormal findings of blood chemistry: Secondary | ICD-10-CM | POA: Diagnosis not present

## 2021-12-17 DIAGNOSIS — Z947 Corneal transplant status: Secondary | ICD-10-CM | POA: Diagnosis not present

## 2021-12-20 DIAGNOSIS — I340 Nonrheumatic mitral (valve) insufficiency: Secondary | ICD-10-CM | POA: Diagnosis not present

## 2021-12-20 DIAGNOSIS — I341 Nonrheumatic mitral (valve) prolapse: Secondary | ICD-10-CM | POA: Diagnosis not present

## 2021-12-20 DIAGNOSIS — I361 Nonrheumatic tricuspid (valve) insufficiency: Secondary | ICD-10-CM | POA: Diagnosis not present

## 2022-01-17 DIAGNOSIS — R946 Abnormal results of thyroid function studies: Secondary | ICD-10-CM | POA: Diagnosis not present

## 2022-01-17 DIAGNOSIS — Z947 Corneal transplant status: Secondary | ICD-10-CM | POA: Diagnosis not present

## 2022-01-19 DIAGNOSIS — R946 Abnormal results of thyroid function studies: Secondary | ICD-10-CM | POA: Diagnosis not present

## 2022-01-21 DIAGNOSIS — M8589 Other specified disorders of bone density and structure, multiple sites: Secondary | ICD-10-CM | POA: Diagnosis not present

## 2022-01-21 DIAGNOSIS — K219 Gastro-esophageal reflux disease without esophagitis: Secondary | ICD-10-CM | POA: Diagnosis not present

## 2022-01-21 DIAGNOSIS — E7800 Pure hypercholesterolemia, unspecified: Secondary | ICD-10-CM | POA: Diagnosis not present

## 2022-01-21 DIAGNOSIS — E0590 Thyrotoxicosis, unspecified without thyrotoxic crisis or storm: Secondary | ICD-10-CM | POA: Diagnosis not present

## 2022-03-21 DIAGNOSIS — H16223 Keratoconjunctivitis sicca, not specified as Sjogren's, bilateral: Secondary | ICD-10-CM | POA: Diagnosis not present

## 2022-03-21 DIAGNOSIS — H5213 Myopia, bilateral: Secondary | ICD-10-CM | POA: Diagnosis not present

## 2022-03-21 DIAGNOSIS — Z947 Corneal transplant status: Secondary | ICD-10-CM | POA: Diagnosis not present

## 2022-03-31 DIAGNOSIS — M810 Age-related osteoporosis without current pathological fracture: Secondary | ICD-10-CM | POA: Diagnosis not present

## 2022-05-26 DIAGNOSIS — R42 Dizziness and giddiness: Secondary | ICD-10-CM | POA: Diagnosis not present

## 2022-05-26 DIAGNOSIS — H60541 Acute eczematoid otitis externa, right ear: Secondary | ICD-10-CM | POA: Diagnosis not present

## 2022-05-26 DIAGNOSIS — Z6826 Body mass index (BMI) 26.0-26.9, adult: Secondary | ICD-10-CM | POA: Diagnosis not present

## 2022-06-17 DIAGNOSIS — Z6826 Body mass index (BMI) 26.0-26.9, adult: Secondary | ICD-10-CM | POA: Diagnosis not present

## 2022-06-17 DIAGNOSIS — R1013 Epigastric pain: Secondary | ICD-10-CM | POA: Diagnosis not present

## 2022-06-19 DIAGNOSIS — B029 Zoster without complications: Secondary | ICD-10-CM | POA: Diagnosis not present

## 2022-06-19 DIAGNOSIS — R21 Rash and other nonspecific skin eruption: Secondary | ICD-10-CM | POA: Diagnosis not present

## 2022-07-11 DIAGNOSIS — Z947 Corneal transplant status: Secondary | ICD-10-CM | POA: Diagnosis not present

## 2022-09-15 DIAGNOSIS — L821 Other seborrheic keratosis: Secondary | ICD-10-CM | POA: Diagnosis not present

## 2022-09-15 DIAGNOSIS — D225 Melanocytic nevi of trunk: Secondary | ICD-10-CM | POA: Diagnosis not present

## 2022-09-15 DIAGNOSIS — D2239 Melanocytic nevi of other parts of face: Secondary | ICD-10-CM | POA: Diagnosis not present

## 2022-09-15 DIAGNOSIS — L578 Other skin changes due to chronic exposure to nonionizing radiation: Secondary | ICD-10-CM | POA: Diagnosis not present

## 2022-09-22 DIAGNOSIS — M67442 Ganglion, left hand: Secondary | ICD-10-CM | POA: Diagnosis not present

## 2022-09-23 DIAGNOSIS — Z23 Encounter for immunization: Secondary | ICD-10-CM | POA: Diagnosis not present

## 2022-10-05 DIAGNOSIS — M810 Age-related osteoporosis without current pathological fracture: Secondary | ICD-10-CM | POA: Diagnosis not present

## 2022-10-12 DIAGNOSIS — Z01419 Encounter for gynecological examination (general) (routine) without abnormal findings: Secondary | ICD-10-CM | POA: Diagnosis not present

## 2022-10-12 DIAGNOSIS — Z1231 Encounter for screening mammogram for malignant neoplasm of breast: Secondary | ICD-10-CM | POA: Diagnosis not present

## 2022-12-01 DIAGNOSIS — Z6826 Body mass index (BMI) 26.0-26.9, adult: Secondary | ICD-10-CM | POA: Diagnosis not present

## 2022-12-01 DIAGNOSIS — K219 Gastro-esophageal reflux disease without esophagitis: Secondary | ICD-10-CM | POA: Diagnosis not present

## 2022-12-01 DIAGNOSIS — Z1331 Encounter for screening for depression: Secondary | ICD-10-CM | POA: Diagnosis not present

## 2022-12-01 DIAGNOSIS — Z658 Other specified problems related to psychosocial circumstances: Secondary | ICD-10-CM | POA: Diagnosis not present

## 2022-12-01 DIAGNOSIS — E7800 Pure hypercholesterolemia, unspecified: Secondary | ICD-10-CM | POA: Diagnosis not present

## 2022-12-01 DIAGNOSIS — Z0000 Encounter for general adult medical examination without abnormal findings: Secondary | ICD-10-CM | POA: Diagnosis not present

## 2022-12-01 DIAGNOSIS — M810 Age-related osteoporosis without current pathological fracture: Secondary | ICD-10-CM | POA: Diagnosis not present

## 2022-12-01 DIAGNOSIS — Z79899 Other long term (current) drug therapy: Secondary | ICD-10-CM | POA: Diagnosis not present

## 2022-12-14 ENCOUNTER — Encounter: Payer: Self-pay | Admitting: Cardiology

## 2022-12-14 ENCOUNTER — Encounter: Payer: Self-pay | Admitting: *Deleted

## 2022-12-14 DIAGNOSIS — R32 Unspecified urinary incontinence: Secondary | ICD-10-CM

## 2022-12-14 DIAGNOSIS — Z7409 Other reduced mobility: Secondary | ICD-10-CM | POA: Insufficient documentation

## 2022-12-14 DIAGNOSIS — Z780 Asymptomatic menopausal state: Secondary | ICD-10-CM

## 2022-12-14 DIAGNOSIS — Z789 Other specified health status: Secondary | ICD-10-CM | POA: Insufficient documentation

## 2022-12-14 DIAGNOSIS — C4491 Basal cell carcinoma of skin, unspecified: Secondary | ICD-10-CM

## 2022-12-14 DIAGNOSIS — M8580 Other specified disorders of bone density and structure, unspecified site: Secondary | ICD-10-CM | POA: Insufficient documentation

## 2022-12-14 HISTORY — DX: Basal cell carcinoma of skin, unspecified: C44.91

## 2022-12-14 HISTORY — DX: Other specified health status: Z78.9

## 2022-12-14 HISTORY — DX: Other specified disorders of bone density and structure, unspecified site: M85.80

## 2022-12-14 HISTORY — DX: Asymptomatic menopausal state: Z78.0

## 2022-12-14 HISTORY — DX: Unspecified urinary incontinence: R32

## 2023-01-05 NOTE — Progress Notes (Deleted)
Cardiology Office Note:    Date:  01/05/2023   ID:  Cheryl Harrington, DOB 01/25/1946, MRN [id number]  PCP:  Ronita Hipps, MD  Cardiologist:  Shirlee More, MD   Referring MD: Ronita Hipps, MD  ASSESSMENT:    No diagnosis found. PLAN:    In order of problems listed above:  ***  Next appointment   Medication Adjustments/Labs and Tests Ordered: Current medicines are reviewed at length with the patient today.  Concerns regarding medicines are outlined above.  No orders of the defined types were placed in this encounter.  No orders of the defined types were placed in this encounter.    No chief complaint on file. ***  History of Present Illness:    Cheryl Harrington is a 77 y.o. female with a history of hyperlipidemia she was seen by her PCP 12/01/2022 she had a previous echocardiogram in 2023 showing normal left ventricular function and no other abnormality..  She was continued on lipid-lowering therapy.  She is being seen today for at the request of Ronita Hipps, MD.  She did have CT angiogram of the neck performed 12/02/2021 which showed minimal calcific plaque left carotid bifurcation and right carotid bifurcation.  Past Medical History:  Diagnosis Date   Basal cell carcinoma of skin 12/14/2022   Had basal cell carcinoma excised from right labia in12/2014 and then further treated with additional excision and cryo 10/2012 per Dr. Harrington Challenger note. Path report indicated positive peripheral and deep edges. Colpo for completeness 12/2015 - no bx needed. Per pt and confirmed with exam - area of prior biopsy R anterior labia. No residual skin changes noted.   Closed fracture of left orbital floor with routine healing 12/09/2021   Closed fracture of maxillary sinus (HCC) 12/09/2021   Elevated cholesterol    Fall 12/02/2021   GERD (gastroesophageal reflux disease)    Heel cord tightness, right 01/14/2020   Impaired mobility and ADLs 12/14/2022   Menopause present 12/14/2022    Osteopenia 12/14/2022   dx 2004- Actonel/Fosamax x 28yrs, followed by PCP, last dexa 2017 - per pt PCP has restarted her on bisphosphnate med   Osteoporosis    Plantar fasciitis 01/14/2020   Urinary incontinence 12/14/2022   occasional stress UI- discussed causes and reviewed mgmt options with pt 11/2015- declines intervention at this time, still declining PT etc at 2018 visit.    Past Surgical History:  Procedure Laterality Date   CATARACT EXTRACTION Bilateral 2007   CHOLECYSTECTOMY  2000   INGUINAL HERNIA REPAIR  1997    Current Medications: No outpatient medications have been marked as taking for the 01/06/23 encounter (Appointment) with Richardo Priest, MD.     Allergies:   Patient has no known allergies.   Social History   Socioeconomic History   Marital status: Married    Spouse name: Not on file   Number of children: Not on file   Years of education: Not on file   Highest education level: Not on file  Occupational History   Not on file  Tobacco Use   Smoking status: Never   Smokeless tobacco: Not on file  Substance and Sexual Activity   Alcohol use: Never   Drug use: Not on file   Sexual activity: Not on file  Other Topics Concern   Not on file  Social History Narrative   Not on file   Social Determinants of Health   Financial Resource Strain: Not on file  Food Insecurity: Not  on file  Transportation Needs: Not on file  Physical Activity: Not on file  Stress: Not on file  Social Connections: Not on file     Family History: The patient's ***family history includes Colon cancer in her father; Congestive Heart Failure in her mother; Heart attack in her mother; Multiple myeloma in her mother; Stroke in her maternal grandmother and paternal grandmother.  ROS:   ROS Please see the history of present illness.    *** All other systems reviewed and are negative.  EKGs/Labs/Other Studies Reviewed:    The following studies were reviewed today: ***      EKG:   EKG is *** ordered today.  The ekg ordered today is personally reviewed and demonstrates ***  Recent Labs: No results found for requested labs within last 365 days.  Recent Lipid Panel No results found for: "CHOL", "TRIG", "HDL", "CHOLHDL", "VLDL", "LDLCALC", "LDLDIRECT"  Physical Exam:    VS:  There were no vitals taken for this visit.    Wt Readings from Last 3 Encounters:  12/01/22 152 lb (68.9 kg)     GEN: *** Well nourished, well developed in no acute distress HEENT: Normal NECK: No JVD; No carotid bruits LYMPHATICS: No lymphadenopathy CARDIAC: ***RRR, no murmurs, rubs, gallops RESPIRATORY:  Clear to auscultation without rales, wheezing or rhonchi  ABDOMEN: Soft, non-tender, non-distended MUSCULOSKELETAL:  No edema; No deformity  SKIN: Warm and dry NEUROLOGIC:  Alert and oriented x 3 PSYCHIATRIC:  Normal affect     Signed, Shirlee More, MD  01/05/2023 5:44 PM    [facility]

## 2023-01-06 ENCOUNTER — Ambulatory Visit: Payer: PPO | Admitting: Cardiology

## 2023-01-06 DIAGNOSIS — Z7189 Other specified counseling: Secondary | ICD-10-CM

## 2023-02-01 DIAGNOSIS — E7800 Pure hypercholesterolemia, unspecified: Secondary | ICD-10-CM | POA: Insufficient documentation

## 2023-02-01 DIAGNOSIS — M810 Age-related osteoporosis without current pathological fracture: Secondary | ICD-10-CM | POA: Insufficient documentation

## 2023-02-01 DIAGNOSIS — K219 Gastro-esophageal reflux disease without esophagitis: Secondary | ICD-10-CM | POA: Insufficient documentation

## 2023-02-02 NOTE — Progress Notes (Signed)
ID:  Cheryl Harrington, DOB 12/09/1945, MRN [id number]  PCP:  Marylen Ponto, MD  Cardiologist:  Norman Herrlich, MD   Referring MD: Marylen Ponto, MD  ASSESSMENT:    1. Precordial pain   2. Chest pain of uncertain etiology   3. Exertional shortness of breath   4. Left bundle branch block (LBBB)   5. Shortened PR interval   6. Elevated cholesterol    PLAN:    In order of problems listed above:  Initially this was a visit to assess cardiovascular risk (evolved into evaluation for symptoms chest pain shortness of breath and multiple EKG abnormalities short PR interval without preexcitation and left bundle branch block.  Her echocardiogram a year ago shows a structurally normal heart she does not have cardiomyopathy and not feel need to repeat it.  I do think she needs undergo an ischemia evaluation and after discussion of techniques she chooses CTA which will be scheduled no given his coronary calcium score although in her age group it is less predictive and also the presence or absence of obstructive CAD especially with her family history. I will continue her lipid-lowering therapy  Next appointment 3 months   Medication Adjustments/Labs and Tests Ordered: Current medicines are reviewed at length with the patient today.  Concerns regarding medicines are outlined above.  Orders Placed This Encounter  Procedures   CT CORONARY MORPH W/CTA COR W/SCORE W/CA W/CM &/OR WO/CM   Basic Metabolic Panel (BMET)   EKG 12-Lead   Meds ordered this encounter  Medications   metoprolol tartrate (LOPRESSOR) 100 MG tablet    Sig: Take 1 tablet (100 mg total) by mouth once for 1 dose. Please take this medication 2 hours before CT.    Dispense:  1 tablet    Refill:  0    Chief complaint I am concerned that I have heart disease  History of Present Illness:    Cheryl Harrington is a 77 y.o. female who is being seen today for the evaluation of heart disease at the request of Marylen Ponto,  MD.  Her brother age 37 sustained myocardial infarction unfortunately died during transport to the hospital.  Her mother also died of heart disease.  Since the death of her brother she has been very concerned about her potential. 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 She has no history of congenital or rheumatic heart disease or atrial fibrillation She is a vigorous active woman she has been on long-term statin therapy. In the last year she notices she is breathless with doing activities like gardening work is not severe limiting fluids to do she has also had episodes of burning in the left chest with and without activity. Her EKG in our office today shows left bundle branch block she is a short PR interval but I would not define her EKG as preexcitation She has had no edema orthopnea or exertional chest pain  She had an echocardiogram performed at [facility] 12/20/2021 left ventricular ejection fraction 60 to 65% grade 1 diastolic filling pattern normal left atrial pressure otherwise normal test structurally normal heart no valvular abnormality.   CT 12/18/2022 she had facial trauma and small volume stable subarachnoid hemorrhage. Past Medical History:  Diagnosis Date   Basal cell carcinoma of skin 12/14/2022  Had basal cell carcinoma excised from right labia in12/2014 and then further treated with additional excision and cryo 10/2012 per Dr. Tenny Craw note. Path report indicated positive peripheral and deep edges. Colpo for completeness 12/2015 - no bx needed. Per pt and confirmed with exam - area of prior biopsy R anterior labia. No residual skin changes noted.   Closed fracture of left orbital floor with routine healing 12/09/2021   Closed fracture of maxillary sinus (HCC) 12/09/2021   Elevated  cholesterol    Fall 12/02/2021   GERD (gastroesophageal reflux disease)    Heel cord tightness, right 01/14/2020   Impaired mobility and ADLs 12/14/2022   Menopause present 12/14/2022   Osteopenia 12/14/2022   dx 2004- Actonel/Fosamax x 26yrs, followed by PCP, last dexa 2017 - per pt PCP has restarted her on bisphosphnate med   Osteoporosis    Plantar fasciitis 01/14/2020   Urinary incontinence 12/14/2022   occasional stress UI- discussed causes and reviewed mgmt options with pt 11/2015- declines intervention at this time, still declining PT etc at 2018 visit.    Past Surgical History:  Procedure Laterality Date   CATARACT EXTRACTION Bilateral 2007   CHOLECYSTECTOMY  2000   INGUINAL HERNIA REPAIR  1997    Current Medications: Current Meds  Medication Sig   acetaminophen (TYLENOL) 325 MG tablet Take 325 mg by mouth every 6 (six) hours as needed for mild pain or moderate pain.   ascorbic acid (VITAMIN C) 500 MG tablet Take 500 mg by mouth daily.   aspirin EC 81 MG tablet Take 81 mg by mouth daily. Swallow whole.   calcium carbonate (SUPER CALCIUM) 1500 (600 Ca) MG TABS tablet Take 2 tablets by mouth daily.   citalopram (CELEXA) 10 MG tablet Take 10 mg by mouth daily.   denosumab (PROLIA) 60 MG/ML SOSY injection Inject 60 mg into the skin every 6 (six) months.   metoprolol tartrate (LOPRESSOR) 100 MG tablet Take 1 tablet (100 mg total) by mouth once for 1 dose. Please take this medication 2 hours before CT.   Multiple Vitamin (MULTI-VITAMIN) tablet Take 1 tablet by mouth daily.   omeprazole (PRILOSEC) 40 MG capsule Take 40 mg by mouth daily.   rosuvastatin (CRESTOR) 10 MG tablet Take 10 mg by mouth daily.     Allergies:   Patient has no known allergies.   Social History   Socioeconomic History   Marital status: Married    Spouse name: Not on file   Number of children: Not on file   Years of education: Not on file   Highest education level: Not on file  Occupational History    Not on file  Tobacco Use   Smoking status: Never   Smokeless tobacco: Never  Vaping Use   Vaping Use: Never used  Substance and Sexual Activity   Alcohol use: Never   Drug use: Never   Sexual activity: Not on file  Other Topics Concern   Not on file  Social History Narrative   Not on file   Social Determinants of Health   Financial Resource Strain: Not on file  Food Insecurity: Not on file  Transportation Needs: Not on file  Physical Activity: Not on file  Stress: Not on file  Social Connections: Not on file     Family History: The patient's family history includes Colon cancer in her father; Congestive Heart Failure in her mother; Heart attack in her mother; Heart attack (age of onset: 53) in her brother; Multiple myeloma in her  mother; Stroke in her maternal grandmother and paternal grandmother.  ROS:   ROS Please see the history of present illness.     All other systems reviewed and are negative.  EKGs/Labs/Other Studies Reviewed:    The following studies were reviewed today:  EKG:  EKG is  ordered today.  The ekg ordered today is personally reviewed and demonstrates sinus rhythm with short PR interval 160 ms she has left bundle branch block  Recent Labs: 11/29/2021 cholesterol 135 triglycerides 66 HDL 62 hemoglobin 12.9 creatinine 0.6 potassium 4.4  Physical Exam:    VS:  BP 126/82 (BP Location: Right Arm, Patient Position: Sitting, Cuff Size: Normal)   Pulse 68   Ht 5\' 4"  (1.626 m)   Wt 153 lb 3.2 oz (69.5 kg)   SpO2 97%   BMI 26.30 kg/m     Wt Readings from Last 3 Encounters:  02/03/23 153 lb 3.2 oz (69.5 kg)  12/01/22 152 lb (68.9 kg)     GEN:  Well nourished, well developed in no acute distress HEENT: Normal NECK: No JVD; No carotid bruits LYMPHATICS: No lymphadenopathy CARDIAC: Second heart sound is paradoxical RRR, no murmurs, rubs, gallops RESPIRATORY:  Clear to auscultation without rales, wheezing or rhonchi  ABDOMEN: Soft, non-tender,  non-distended MUSCULOSKELETAL:  No edema; No deformity  SKIN: Warm and dry NEUROLOGIC:  Alert and oriented x 3 PSYCHIATRIC:  Normal affect     Signed, Norman Herrlich, MD  02/03/2023 11:44 AM    [facility]

## 2023-02-03 ENCOUNTER — Ambulatory Visit: Payer: PPO | Attending: Cardiology | Admitting: Cardiology

## 2023-02-03 ENCOUNTER — Encounter: Payer: Self-pay | Admitting: Cardiology

## 2023-02-03 VITALS — BP 126/82 | HR 68 | Ht 64.0 in | Wt 153.2 lb

## 2023-02-03 DIAGNOSIS — E7800 Pure hypercholesterolemia, unspecified: Secondary | ICD-10-CM | POA: Diagnosis not present

## 2023-02-03 DIAGNOSIS — R9431 Abnormal electrocardiogram [ECG] [EKG]: Secondary | ICD-10-CM

## 2023-02-03 DIAGNOSIS — R079 Chest pain, unspecified: Secondary | ICD-10-CM | POA: Diagnosis not present

## 2023-02-03 DIAGNOSIS — I447 Left bundle-branch block, unspecified: Secondary | ICD-10-CM | POA: Diagnosis not present

## 2023-02-03 DIAGNOSIS — R072 Precordial pain: Secondary | ICD-10-CM

## 2023-02-03 DIAGNOSIS — R0602 Shortness of breath: Secondary | ICD-10-CM

## 2023-02-03 MED ORDER — METOPROLOL TARTRATE 100 MG PO TABS: 100.0000 mg | ORAL_TABLET | Freq: Once | ORAL | 0 refills | Status: DC

## 2023-02-03 NOTE — Patient Instructions (Signed)
Medication Instructions:  Your physician recommends that you continue on your current medications as directed. Please refer to the Current Medication list given to you today.  *If you need a refill on your cardiac medications before your next appointment, please call your pharmacy*   Lab Work: Your physician recommends that you return for lab work in:   Labs 1 week before CT: BMP  If you have labs (blood work) drawn today and your tests are completely normal, you will receive your results only by: MyChart Message (if you have MyChart) OR A paper copy in the mail If you have any lab test that is abnormal or we need to change your treatment, we will call you to review the results.   Testing/Procedures:   Your cardiac CT will be scheduled at one of the below locations:   [facility] [address] [place], [place] [address] [phone]  OR  [facility] [address] [place], [place] [address] [phone]  OR   [facility] [address] [place], [place] [address] [phone]  If scheduled at [facility], please arrive at the Women's and Children's Entrance (Entrance C2) of [facility] 30 minutes prior to test start time. You can use the FREE valet parking offered at entrance C (encouraged to control the heart rate for the test)  Proceed to the [facility] (first floor) to check-in and test prep.  All radiology patients and guests should use entrance C2 at [facility], accessed from [address], even though the hospital's physical address listed is [address].    If scheduled at [facility] or [facility], please arrive 15 mins early for check-in and test prep.   Please follow these instructions carefully (unless otherwise directed):  On the Night Before the Test: Be  sure to Drink plenty of water. Do not consume any caffeinated/decaffeinated beverages or chocolate 12 hours prior to your test. Do not take any antihistamines 12 hours prior to your test.  On the Day of the Test: Drink plenty of water until 1 hour prior to the test. Do not eat any food 1 hour prior to test. You may take your regular medications prior to the test.  Take metoprolol (Lopressor) two hours prior to test. FEMALES- please wear underwire-free bra if available, avoid dresses & tight clothing      After the Test: Drink plenty of water. After receiving IV contrast, you may experience a mild flushed feeling. This is normal. On occasion, you may experience a mild rash up to 24 hours after the test. This is not dangerous. If this occurs, you can take Benadryl 25 mg and increase your fluid intake. If you experience trouble breathing, this can be serious. If it is severe call 911 IMMEDIATELY. If it is mild, please call our office. If you take any of these medications: Glipizide/Metformin, Avandament, Glucavance, please do not take 48 hours after completing test unless otherwise instructed.  We will call to schedule your test 2-4 weeks out understanding that some insurance companies will need an authorization prior to the service being performed.   For non-scheduling related questions, please contact the cardiac imaging nurse navigator should you have any questions/concerns: Sara Wallace, Cardiac Imaging Nurse Navigator Merle Prescott, Cardiac Imaging Nurse Navigator [facility] Direct Office Dial: [phone]   For scheduling needs, including cancellations and rescheduling, please   call Brittany, [phone].    Follow-Up: At [facility], you and your health needs are our priority.  As part of our continuing mission to provide you with exceptional heart care, we have created designated Provider Care Teams.  These Care Teams include your primary  Cardiologist (physician) and Advanced Practice Providers (APPs -  Physician Assistants and Nurse Practitioners) who all work together to provide you with the care you need, when you need it.  We recommend signing up for the patient portal called "MyChart".  Sign up information is provided on this After Visit Summary.  MyChart is used to connect with patients for Virtual Visits (Telemedicine).  Patients are able to view lab/test results, encounter notes, upcoming appointments, etc.  Non-urgent messages can be sent to your provider as well.   To learn more about what you can do with MyChart, go to [email or web address].    Your next appointment:   3 month(s)  Provider:   Brian Munley, MD    Other Instructions None  

## 2023-02-06 DIAGNOSIS — R072 Precordial pain: Secondary | ICD-10-CM | POA: Diagnosis not present

## 2023-02-06 LAB — BASIC METABOLIC PANEL
BUN/Creatinine Ratio: 22 (ref 12–28)
BUN: 13 mg/dL (ref 8–27)
CO2: 25 mmol/L (ref 20–29)
Calcium: 9.6 mg/dL (ref 8.7–10.3)
Chloride: 104 mmol/L (ref 96–106)
Creatinine, Ser: 0.58 mg/dL (ref 0.57–1.00)
Glucose: 104 mg/dL — ABNORMAL HIGH (ref 70–99)
Potassium: 4.7 mmol/L (ref 3.5–5.2)
Sodium: 141 mmol/L (ref 134–144)
eGFR: 93 mL/min/{1.73_m2} (ref >59)

## 2023-02-09 ENCOUNTER — Telehealth (HOSPITAL_COMMUNITY): Payer: Self-pay | Admitting: Emergency Medicine

## 2023-02-09 DIAGNOSIS — L608 Other nail disorders: Secondary | ICD-10-CM | POA: Diagnosis not present

## 2023-02-09 NOTE — Telephone Encounter (Signed)
Reaching out to patient to offer assistance regarding upcoming cardiac imaging study; pt verbalizes understanding of appt date/time, parking situation and where to check in, pre-test NPO status and medications ordered, and verified current allergies; name and call back number provided for further questions should they arise Arriana Lohmann RN Navigator Cardiac Imaging [facility] [phone] office [phone] cell 

## 2023-02-10 ENCOUNTER — Ambulatory Visit (HOSPITAL_COMMUNITY)
Admission: RE | Admit: 2023-02-10 | Discharge: 2023-02-10 | Disposition: A | Discharge status: Home / Self Care | Payer: PPO | Source: Ambulatory Visit | Attending: Cardiology | Admitting: Cardiology

## 2023-02-10 DIAGNOSIS — R072 Precordial pain: Secondary | ICD-10-CM | POA: Diagnosis not present

## 2023-02-10 MED ORDER — NITROGLYCERIN 0.4 MG SL SUBL
SUBLINGUAL_TABLET | SUBLINGUAL | Status: AC
  Filled 2023-02-10: qty 2

## 2023-02-10 MED ORDER — IOHEXOL 350 MG/ML SOLN
100.0000 mL | Freq: Once | INTRAVENOUS | Status: AC | PRN
  Administered 2023-02-10: 100 mL via INTRAVENOUS

## 2023-02-10 MED ORDER — NITROGLYCERIN 0.4 MG SL SUBL
0.8000 mg | SUBLINGUAL_TABLET | Freq: Once | SUBLINGUAL | Status: AC
  Administered 2023-02-10: 0.8 mg via SUBLINGUAL

## 2023-02-13 ENCOUNTER — Telehealth: Payer: Self-pay

## 2023-02-13 NOTE — Telephone Encounter (Signed)
-----   Message from Flossie Dibble, NP sent at 02/10/2023  5:15 PM EDT ----- Ms. 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 This is likely not causing you any symptoms as it is very mild. Continue ASA, Crestor, this will both help prevent further disease. Good results. Best, Victorino Dike

## 2023-02-13 NOTE — Telephone Encounter (Signed)
Patient notified of results.

## 2023-03-14 DIAGNOSIS — L608 Other nail disorders: Secondary | ICD-10-CM | POA: Diagnosis not present

## 2023-03-28 DIAGNOSIS — L608 Other nail disorders: Secondary | ICD-10-CM | POA: Diagnosis not present

## 2023-04-02 ENCOUNTER — Emergency Department (HOSPITAL_COMMUNITY): Payer: PPO

## 2023-04-02 ENCOUNTER — Other Ambulatory Visit: Payer: Self-pay

## 2023-04-02 ENCOUNTER — Inpatient Hospital Stay (HOSPITAL_COMMUNITY)
Admission: EM | Admit: 2023-04-02 | Discharge: 2023-04-03 | DRG: 087 | Disposition: A | Discharge status: Home / Self Care | Payer: PPO | Attending: Surgery | Admitting: Surgery

## 2023-04-02 ENCOUNTER — Encounter (HOSPITAL_COMMUNITY): Payer: Self-pay

## 2023-04-02 DIAGNOSIS — K219 Gastro-esophageal reflux disease without esophagitis: Secondary | ICD-10-CM | POA: Diagnosis present

## 2023-04-02 DIAGNOSIS — Y92480 Sidewalk as the place of occurrence of the external cause: Secondary | ICD-10-CM | POA: Diagnosis not present

## 2023-04-02 DIAGNOSIS — R410 Disorientation, unspecified: Secondary | ICD-10-CM | POA: Diagnosis not present

## 2023-04-02 DIAGNOSIS — S0231XA Fracture of orbital floor, right side, initial encounter for closed fracture: Secondary | ICD-10-CM | POA: Diagnosis present

## 2023-04-02 DIAGNOSIS — Z823 Family history of stroke: Secondary | ICD-10-CM | POA: Diagnosis not present

## 2023-04-02 DIAGNOSIS — R58 Hemorrhage, not elsewhere classified: Secondary | ICD-10-CM | POA: Diagnosis not present

## 2023-04-02 DIAGNOSIS — Z85828 Personal history of other malignant neoplasm of skin: Secondary | ICD-10-CM | POA: Diagnosis not present

## 2023-04-02 DIAGNOSIS — S066XAA Traumatic subarachnoid hemorrhage with loss of consciousness status unknown, initial encounter: Secondary | ICD-10-CM | POA: Diagnosis not present

## 2023-04-02 DIAGNOSIS — S0633AA Contusion and laceration of cerebrum, unspecified, with loss of consciousness status unknown, initial encounter: Secondary | ICD-10-CM | POA: Diagnosis not present

## 2023-04-02 DIAGNOSIS — S270XXA Traumatic pneumothorax, initial encounter: Secondary | ICD-10-CM | POA: Diagnosis not present

## 2023-04-02 DIAGNOSIS — W2209XA Striking against other stationary object, initial encounter: Secondary | ICD-10-CM | POA: Diagnosis present

## 2023-04-02 DIAGNOSIS — S066X1A Traumatic subarachnoid hemorrhage with loss of consciousness of 30 minutes or less, initial encounter: Principal | ICD-10-CM | POA: Diagnosis present

## 2023-04-02 DIAGNOSIS — S299XXA Unspecified injury of thorax, initial encounter: Secondary | ICD-10-CM | POA: Diagnosis not present

## 2023-04-02 DIAGNOSIS — Z807 Family history of other malignant neoplasms of lymphoid, hematopoietic and related tissues: Secondary | ICD-10-CM

## 2023-04-02 DIAGNOSIS — M810 Age-related osteoporosis without current pathological fracture: Secondary | ICD-10-CM | POA: Diagnosis not present

## 2023-04-02 DIAGNOSIS — E7800 Pure hypercholesterolemia, unspecified: Secondary | ICD-10-CM | POA: Diagnosis present

## 2023-04-02 DIAGNOSIS — W010XXA Fall on same level from slipping, tripping and stumbling without subsequent striking against object, initial encounter: Secondary | ICD-10-CM | POA: Diagnosis present

## 2023-04-02 DIAGNOSIS — Z8249 Family history of ischemic heart disease and other diseases of the circulatory system: Secondary | ICD-10-CM | POA: Diagnosis not present

## 2023-04-02 DIAGNOSIS — S065XAA Traumatic subdural hemorrhage with loss of consciousness status unknown, initial encounter: Secondary | ICD-10-CM | POA: Diagnosis not present

## 2023-04-02 DIAGNOSIS — M50322 Other cervical disc degeneration at C5-C6 level: Secondary | ICD-10-CM | POA: Diagnosis not present

## 2023-04-02 DIAGNOSIS — S06361A Traumatic hemorrhage of cerebrum, unspecified, with loss of consciousness of 30 minutes or less, initial encounter: Secondary | ICD-10-CM | POA: Diagnosis not present

## 2023-04-02 DIAGNOSIS — Z043 Encounter for examination and observation following other accident: Secondary | ICD-10-CM | POA: Diagnosis not present

## 2023-04-02 DIAGNOSIS — S0181XA Laceration without foreign body of other part of head, initial encounter: Secondary | ICD-10-CM | POA: Diagnosis present

## 2023-04-02 DIAGNOSIS — M47812 Spondylosis without myelopathy or radiculopathy, cervical region: Secondary | ICD-10-CM | POA: Diagnosis not present

## 2023-04-02 DIAGNOSIS — W1830XA Fall on same level, unspecified, initial encounter: Secondary | ICD-10-CM | POA: Diagnosis not present

## 2023-04-02 DIAGNOSIS — S01111A Laceration without foreign body of right eyelid and periocular area, initial encounter: Secondary | ICD-10-CM | POA: Diagnosis not present

## 2023-04-02 DIAGNOSIS — Z79899 Other long term (current) drug therapy: Secondary | ICD-10-CM | POA: Diagnosis not present

## 2023-04-02 DIAGNOSIS — S065X1A Traumatic subdural hemorrhage with loss of consciousness of 30 minutes or less, initial encounter: Secondary | ICD-10-CM | POA: Diagnosis present

## 2023-04-02 DIAGNOSIS — I10 Essential (primary) hypertension: Secondary | ICD-10-CM | POA: Diagnosis present

## 2023-04-02 DIAGNOSIS — S0240CA Maxillary fracture, right side, initial encounter for closed fracture: Secondary | ICD-10-CM | POA: Diagnosis not present

## 2023-04-02 DIAGNOSIS — S0990XA Unspecified injury of head, initial encounter: Secondary | ICD-10-CM | POA: Diagnosis not present

## 2023-04-02 DIAGNOSIS — Z7982 Long term (current) use of aspirin: Secondary | ICD-10-CM | POA: Diagnosis not present

## 2023-04-02 DIAGNOSIS — S065X0A Traumatic subdural hemorrhage without loss of consciousness, initial encounter: Secondary | ICD-10-CM | POA: Diagnosis not present

## 2023-04-02 DIAGNOSIS — M4802 Spinal stenosis, cervical region: Secondary | ICD-10-CM | POA: Diagnosis not present

## 2023-04-02 DIAGNOSIS — S0292XA Unspecified fracture of facial bones, initial encounter for closed fracture: Secondary | ICD-10-CM

## 2023-04-02 DIAGNOSIS — Z800 Family history of malignant neoplasm of digestive organs: Secondary | ICD-10-CM

## 2023-04-02 DIAGNOSIS — S066X0A Traumatic subarachnoid hemorrhage without loss of consciousness, initial encounter: Secondary | ICD-10-CM | POA: Diagnosis not present

## 2023-04-02 DIAGNOSIS — R569 Unspecified convulsions: Secondary | ICD-10-CM | POA: Diagnosis not present

## 2023-04-02 DIAGNOSIS — R40241 Glasgow coma scale score 13-15, unspecified time: Secondary | ICD-10-CM | POA: Diagnosis not present

## 2023-04-02 DIAGNOSIS — S02401A Maxillary fracture, unspecified, initial encounter for closed fracture: Secondary | ICD-10-CM | POA: Diagnosis not present

## 2023-04-02 DIAGNOSIS — W19XXXA Unspecified fall, initial encounter: Secondary | ICD-10-CM | POA: Diagnosis present

## 2023-04-02 DIAGNOSIS — R55 Syncope and collapse: Secondary | ICD-10-CM | POA: Diagnosis not present

## 2023-04-02 DIAGNOSIS — S0285XA Fracture of orbit, unspecified, initial encounter for closed fracture: Secondary | ICD-10-CM | POA: Diagnosis not present

## 2023-04-02 DIAGNOSIS — I611 Nontraumatic intracerebral hemorrhage in hemisphere, cortical: Secondary | ICD-10-CM | POA: Diagnosis not present

## 2023-04-02 DIAGNOSIS — S0511XA Contusion of eyeball and orbital tissues, right eye, initial encounter: Secondary | ICD-10-CM | POA: Diagnosis not present

## 2023-04-02 LAB — I-STAT CHEM 8, ED
BUN: 17 mg/dL (ref 8–23)
Calcium, Ion: 1.1 mmol/L — ABNORMAL LOW (ref 1.15–1.40)
Chloride: 105 mmol/L (ref 98–111)
Creatinine, Ser: 0.7 mg/dL (ref 0.44–1.00)
Glucose, Bld: 130 mg/dL — ABNORMAL HIGH (ref 70–99)
HCT: 31 % — ABNORMAL LOW (ref 36.0–46.0)
Hemoglobin: 10.5 g/dL — ABNORMAL LOW (ref 12.0–15.0)
Potassium: 3.7 mmol/L (ref 3.5–5.1)
Sodium: 142 mmol/L (ref 135–145)
TCO2: 26 mmol/L (ref 22–32)

## 2023-04-02 LAB — CBC
HCT: 33.9 % — ABNORMAL LOW (ref 36.0–46.0)
Hemoglobin: 11 g/dL — ABNORMAL LOW (ref 12.0–15.0)
MCH: 32.2 pg (ref 26.0–34.0)
MCHC: 32.4 g/dL (ref 30.0–36.0)
MCV: 99.1 fL (ref 80.0–100.0)
Platelets: 165 10*3/uL (ref 150–400)
RBC: 3.42 MIL/uL — ABNORMAL LOW (ref 3.87–5.11)
RDW: 12.6 % (ref 11.5–15.5)
WBC: 6.6 10*3/uL (ref 4.0–10.5)
nRBC: 0 % (ref 0.0–0.2)

## 2023-04-02 LAB — COMPREHENSIVE METABOLIC PANEL
ALT: 9 U/L (ref 0–44)
AST: 18 U/L (ref 15–41)
Albumin: 3.2 g/dL — ABNORMAL LOW (ref 3.5–5.0)
Alkaline Phosphatase: 59 U/L (ref 38–126)
Anion gap: 9 (ref 5–15)
BUN: 16 mg/dL (ref 8–23)
CO2: 23 mmol/L (ref 22–32)
Calcium: 8.2 mg/dL — ABNORMAL LOW (ref 8.9–10.3)
Chloride: 107 mmol/L (ref 98–111)
Creatinine, Ser: 0.65 mg/dL (ref 0.44–1.00)
GFR, Estimated: >60 mL/min (ref >60)
Glucose, Bld: 133 mg/dL — ABNORMAL HIGH (ref 70–99)
Potassium: 3.8 mmol/L (ref 3.5–5.1)
Sodium: 139 mmol/L (ref 135–145)
Total Bilirubin: 0.5 mg/dL (ref 0.3–1.2)
Total Protein: 5.4 g/dL — ABNORMAL LOW (ref 6.5–8.1)

## 2023-04-02 LAB — SAMPLE TO BLOOD BANK

## 2023-04-02 LAB — PROTIME-INR
INR: 1.1 (ref 0.8–1.2)
Prothrombin Time: 14.3 seconds (ref 11.4–15.2)

## 2023-04-02 MED ORDER — CITALOPRAM HYDROBROMIDE 10 MG PO TABS
10.0000 mg | ORAL_TABLET | Freq: Every day | ORAL | Status: DC
  Filled 2023-04-02: qty 1

## 2023-04-02 MED ORDER — HYDROMORPHONE HCL 1 MG/ML IJ SOLN
0.5000 mg | Freq: Once | INTRAMUSCULAR | Status: AC
  Administered 2023-04-02: 0.5 mg via INTRAVENOUS
  Filled 2023-04-02: qty 1

## 2023-04-02 MED ORDER — LEVETIRACETAM IN NACL 500 MG/100ML IV SOLN: 500.0000 mg | Freq: Two times a day (BID) | INTRAVENOUS | Status: DC

## 2023-04-02 MED ORDER — LEVETIRACETAM IN NACL 500 MG/100ML IV SOLN
500.0000 mg | Freq: Two times a day (BID) | INTRAVENOUS | Status: DC
  Administered 2023-04-03: 500 mg via INTRAVENOUS
  Filled 2023-04-02: qty 100

## 2023-04-02 MED ORDER — PANTOPRAZOLE SODIUM 40 MG PO TBEC
40.0000 mg | DELAYED_RELEASE_TABLET | Freq: Every day | ORAL | Status: DC
  Administered 2023-04-03: 40 mg via ORAL
  Filled 2023-04-02: qty 1

## 2023-04-02 MED ORDER — HYDROMORPHONE HCL 1 MG/ML IJ SOLN
0.5000 mg | INTRAMUSCULAR | Status: DC | PRN
  Administered 2023-04-03: 0.5 mg via INTRAVENOUS
  Filled 2023-04-02: qty 1

## 2023-04-02 MED ORDER — ACETAMINOPHEN 500 MG PO TABS
1000.0000 mg | ORAL_TABLET | Freq: Four times a day (QID) | ORAL | Status: DC
  Administered 2023-04-03: 1000 mg via ORAL
  Filled 2023-04-02 (×2): qty 2

## 2023-04-02 MED ORDER — LEVETIRACETAM IN NACL 1000 MG/100ML IV SOLN
1000.0000 mg | Freq: Once | INTRAVENOUS | Status: AC
  Administered 2023-04-02: 1000 mg via INTRAVENOUS
  Filled 2023-04-02: qty 100

## 2023-04-02 MED ORDER — TRAMADOL HCL 50 MG PO TABS: 50.0000 mg | ORAL_TABLET | Freq: Four times a day (QID) | ORAL | Status: DC | PRN

## 2023-04-02 MED ORDER — ONDANSETRON HCL 4 MG/2ML IJ SOLN
4.0000 mg | Freq: Once | INTRAMUSCULAR | Status: AC
  Administered 2023-04-02: 4 mg via INTRAVENOUS
  Filled 2023-04-02: qty 2

## 2023-04-02 MED ORDER — METOPROLOL TARTRATE 5 MG/5ML IV SOLN: 5.0000 mg | Freq: Four times a day (QID) | INTRAVENOUS | Status: DC | PRN

## 2023-04-02 MED ORDER — ONDANSETRON HCL 4 MG/2ML IJ SOLN
4.0000 mg | Freq: Four times a day (QID) | INTRAMUSCULAR | Status: DC | PRN
  Administered 2023-04-03: 4 mg via INTRAVENOUS
  Filled 2023-04-02: qty 2

## 2023-04-02 MED ORDER — ROSUVASTATIN CALCIUM 5 MG PO TABS
10.0000 mg | ORAL_TABLET | Freq: Every day | ORAL | Status: DC
  Filled 2023-04-02: qty 2

## 2023-04-02 MED ORDER — SODIUM CHLORIDE 0.9 % IV SOLN
3.0000 g | Freq: Four times a day (QID) | INTRAVENOUS | Status: DC
  Administered 2023-04-03 (×2): 3 g via INTRAVENOUS
  Filled 2023-04-02 (×2): qty 8

## 2023-04-02 MED ORDER — LIDOCAINE HCL (PF) 1 % IJ SOLN
5.0000 mL | Freq: Once | INTRAMUSCULAR | Status: AC
  Administered 2023-04-02: 5 mL
  Filled 2023-04-02: qty 5

## 2023-04-02 MED ORDER — POLYETHYLENE GLYCOL 3350 17 G PO PACK: 17.0000 g | PACK | Freq: Every day | ORAL | Status: DC | PRN

## 2023-04-02 MED ORDER — HYDRALAZINE HCL 20 MG/ML IJ SOLN: 10.0000 mg | INTRAMUSCULAR | Status: DC | PRN

## 2023-04-02 MED ORDER — DOCUSATE SODIUM 100 MG PO CAPS
100.0000 mg | ORAL_CAPSULE | Freq: Two times a day (BID) | ORAL | Status: DC
  Administered 2023-04-03: 100 mg via ORAL
  Filled 2023-04-02: qty 1

## 2023-04-02 MED ORDER — ACETAMINOPHEN 500 MG PO TABS: 1000.0000 mg | ORAL_TABLET | Freq: Once | ORAL | Status: DC

## 2023-04-02 MED ORDER — ONDANSETRON 4 MG PO TBDP: 4.0000 mg | ORAL_TABLET | Freq: Four times a day (QID) | ORAL | Status: DC | PRN

## 2023-04-02 MED ORDER — SODIUM CHLORIDE 0.9 % IV SOLN
3.0000 g | Freq: Once | INTRAVENOUS | Status: AC
  Administered 2023-04-02: 3 g via INTRAVENOUS
  Filled 2023-04-02: qty 8

## 2023-04-02 NOTE — Progress Notes (Signed)
Orthopedic Tech Progress Note Patient Details:  Cheryl Harrington 08/21/1946 [id number]  Patient ID: Cheryl Harrington, female   DOB: 1946/03/17, 77 y.o.   MRN: [id number] Level II; not currently needed. [name] 04/02/2023, 5:34 PM

## 2023-04-02 NOTE — Progress Notes (Signed)
Pharmacy Antibiotic Note  Cheryl Harrington is a 77 y.o. female admitted on 04/02/2023 with  traumatic orbital facial fractures .  Pharmacy has been consulted for Unasyn (ampicillin-sulbactam) dosing.  WBC 6.6, Tmax 99 Scr 0.7  Plan: Start Unasyn 3g IV q6h  Height: 5\' 4"  (162.6 cm) Weight: 68 kg (150 lb) IBW/kg (Calculated) : 54.7  Temp (24hrs), Avg:99 F (37.2 C), Min:99 F (37.2 C), Max:99 F (37.2 C)  Recent Labs  Lab 04/02/23 1739 04/02/23 1743  WBC 6.6  --   CREATININE 0.65 0.70    Estimated Creatinine Clearance: 54.9 mL/min (by C-G formula based on SCr of 0.7 mg/dL).    No Known Allergies  Antimicrobials this admission: Unasyn 6/30 >>   Dose adjustments this admission: N/A  Microbiology results: None   Thank you for allowing pharmacy to be a part of this patient's care.  Wilburn Cornelia, PharmD, BCPS Clinical Pharmacist 04/02/2023 7:24 PM   Please refer to [facility] for pharmacy phone number

## 2023-04-02 NOTE — H&P (Signed)
Activation and Reason: Level 2 ground level fall  Primary Survey: Conducted by ED team, Dr. Suezanne Jacquet  HPI: Cheryl Harrington is an 77 y.o. female hx HTN, HLD, GERD presenting emergency department after ground-level fall from standing when walking out of a department store.  Reports that she tripped on the sidewalk and struck her forehead/face on the concrete.  She was initially difficult to arouse and drowsy.  By the time EMS arrived, she was awake, alert, and talking.  She was transported here for further evaluation.  She underwent workup in the emergency department.  After undergoing the workup, we are subsequently asked to see her for admission  Reports that they were walking into [employer] to buy a new phone charging cord when she tripped on the sidewalk.  Her husband was directly behind her and witnessed the fall.  She fell forward and struck her head on the concrete.  Only complaint is facial pain around left face.  Denies pain in her neck, chest, abdomen/pelvis, back, upper extremities, or lower extremities.  Past Medical History:  Diagnosis Date   Basal cell carcinoma of skin 12/14/2022   Had basal cell carcinoma excised from right labia in12/2014 and then further treated with additional excision and cryo 10/2012 per Dr. Tenny Craw note. Path report indicated positive peripheral and deep edges. Colpo for completeness 12/2015 - no bx needed. Per pt and confirmed with exam - area of prior biopsy R anterior labia. No residual skin changes noted.   Closed fracture of left orbital floor with routine healing 12/09/2021   Closed fracture of maxillary sinus (HCC) 12/09/2021   Elevated cholesterol    Fall 12/02/2021   GERD (gastroesophageal reflux disease)    Heel cord tightness, right 01/14/2020   Impaired mobility and ADLs 12/14/2022   Menopause present 12/14/2022   Osteopenia 12/14/2022   dx 2004- Actonel/Fosamax x 81yrs, followed by PCP, last dexa 2017 - per pt PCP has restarted her on  bisphosphnate med   Osteoporosis    Plantar fasciitis 01/14/2020   Urinary incontinence 12/14/2022   occasional stress UI- discussed causes and reviewed mgmt options with pt 11/2015- declines intervention at this time, still declining PT etc at 2018 visit.    Past Surgical History:  Procedure Laterality Date   CATARACT EXTRACTION Bilateral 2007   CHOLECYSTECTOMY  2000   INGUINAL HERNIA REPAIR  1997    Family History  Problem Relation Age of Onset   Multiple myeloma Mother    Congestive Heart Failure Mother    Heart attack Mother    Colon cancer Father    Heart attack Brother 79   Stroke Maternal Grandmother    Stroke Paternal Grandmother     Social:  reports that she has never smoked. She has never used smokeless tobacco. She reports that she does not drink alcohol and does not use drugs.  Allergies: No Known Allergies  Medications: I have reviewed the patient's current medications.  Results for orders placed or performed during the hospital encounter of 04/02/23 (from the past 48 hour(s))  Comprehensive metabolic panel     Status: Abnormal   Collection Time: 04/02/23  5:39 PM  Result Value Ref Range   Sodium 139 135 - 145 mmol/L   Potassium 3.8 3.5 - 5.1 mmol/L   Chloride 107 98 - 111 mmol/L   CO2 23 22 - 32 mmol/L   Glucose, Bld 133 (H) 70 - 99 mg/dL    Comment: Glucose reference range applies only to samples taken  after fasting for at least 8 hours.   BUN 16 8 - 23 mg/dL   Creatinine, Ser 1.61 0.44 - 1.00 mg/dL   Calcium 8.2 (L) 8.9 - 10.3 mg/dL   Total Protein 5.4 (L) 6.5 - 8.1 g/dL   Albumin 3.2 (L) 3.5 - 5.0 g/dL   AST 18 15 - 41 U/L   ALT 9 0 - 44 U/L   Alkaline Phosphatase 59 38 - 126 U/L   Total Bilirubin 0.5 0.3 - 1.2 mg/dL   GFR, Estimated >09 >60 mL/min    Comment: (NOTE) Calculated using the CKD-EPI Creatinine Equation (2021)    Anion gap 9 5 - 15    Comment: Performed at [facility], [address] [place], [place] [address]  CBC      Status: Abnormal   Collection Time: 04/02/23  5:39 PM  Result Value Ref Range   WBC 6.6 4.0 - 10.5 K/uL   RBC 3.42 (L) 3.87 - 5.11 MIL/uL   Hemoglobin 11.0 (L) 12.0 - 15.0 g/dL   HCT 81.1 (L) 91.4 - 78.2 %   MCV 99.1 80.0 - 100.0 fL   MCH 32.2 26.0 - 34.0 pg   MCHC 32.4 30.0 - 36.0 g/dL   RDW 95.6 21.3 - 08.6 %   Platelets 165 150 - 400 K/uL   nRBC 0.0 0.0 - 0.2 %    Comment: Performed at [facility], [address] [place], [place] [address]  Protime-INR     Status: None   Collection Time: 04/02/23  5:39 PM  Result Value Ref Range   Prothrombin Time 14.3 11.4 - 15.2 seconds   INR 1.1 0.8 - 1.2    Comment: (NOTE) INR goal varies based on device and disease states. Performed at [facility], [address] [place], [place] [address]   Sample to Blood Bank     Status: None   Collection Time: 04/02/23  5:39 PM  Result Value Ref Range   Blood Bank Specimen SAMPLE AVAILABLE FOR TESTING    Sample Expiration      04/05/2023,2359 Performed at [facility], [address], [place], [place] [address]   I-Stat Chem 8, ED     Status: Abnormal   Collection Time: 04/02/23  5:43 PM  Result Value Ref Range   Sodium 142 135 - 145 mmol/L   Potassium 3.7 3.5 - 5.1 mmol/L   Chloride 105 98 - 111 mmol/L   BUN 17 8 - 23 mg/dL   Creatinine, Ser 2.44 0.44 - 1.00 mg/dL   Glucose, Bld 010 (H) 70 - 99 mg/dL    Comment: Glucose reference range applies only to samples taken after fasting for at least 8 hours.   Calcium, Ion 1.10 (L) 1.15 - 1.40 mmol/L   TCO2 26 22 - 32 mmol/L   Hemoglobin 10.5 (L) 12.0 - 15.0 g/dL   HCT 27.2 (L) 53.6 - 64.4 %    CT HEAD WO CONTRAST  Result Date: 04/02/2023 CLINICAL DATA:  Head trauma, moderate-severe.  Fall. EXAM: CT HEAD WITHOUT CONTRAST TECHNIQUE: Contiguous axial images were obtained from the base of the skull through the vertex without intravenous contrast. RADIATION DOSE REDUCTION: This exam was performed according to the departmental  dose-optimization program which includes automated exposure control, adjustment of the mA and/or kV according to patient size and/or use of iterative reconstruction technique. COMPARISON:  Head CT 12/01/2021 FINDINGS: Brain: There is scattered acute small to moderate volume subarachnoid hemorrhage  supratentorially, right greater than left and with the greatest amount of hemorrhage being in the posterior right sylvian fissure. Several small acute parenchymal hemorrhages are present in the subcortical [place] matter of the right frontal lobe and measure up to 1.1 cm. There is also a 3 mm subcortical parenchymal hemorrhage in the lateral right temporal lobe. A small subdural hematoma over the superior left cerebral convexity measures 3 mm in thickness. There is also minimal extra-axial hemorrhage along the falx. No midline shift or acute infarct is evident. Mild cerebral atrophy is within normal limits for age. Vascular: Calcified atherosclerosis at the skull base. Skull: No acute calvarial fracture. See separately reported maxillofacial CT. Sinuses/Orbits: See separately reported maxillofacial CT. Other: Right frontal scalp and periorbital hematomas. Critical Value/emergent results were called by telephone at the time of interpretation on 04/02/2023 at 6:58 pm to Dr. Alvino Blood, who verbally acknowledged these results. IMPRESSION: Acute traumatic intracranial hemorrhage including small to moderate volume subarachnoid hemorrhage, small subcortical parenchymal hemorrhages in the right frontal and right occipital lobes, and a small subdural hematoma over the left cerebral convexity. No intracranial mass effect. Electronically Signed   By: Sebastian Ache M.D.   On: 04/02/2023 18:58   CT Maxillofacial Wo Contrast  Result Date: 04/02/2023 CLINICAL DATA:  Facial trauma EXAM: CT MAXILLOFACIAL WITHOUT CONTRAST TECHNIQUE: Multidetector CT imaging of the maxillofacial structures was performed. Multiplanar CT image  reconstructions were also generated. RADIATION DOSE REDUCTION: This exam was performed according to the departmental dose-optimization program which includes automated exposure control, adjustment of the mA and/or kV according to patient size and/or use of iterative reconstruction technique. COMPARISON:  CT 12/01/2021 FINDINGS: Osseous: Trace fluid in the inferior right mastoid air cells. Mandibular heads are normally position. No mandibular fracture. Pterygoid plates and zygomatic arches appear intact. No acute nasal bone fracture. Orbits: Acute nondisplaced fracture floor of right orbit. Mild chronic deformity lateral wall right orbit. Small focus of intraorbital gas in the inferior right orbit. Sinuses: Moderate right hemosinus. Mild opacification of ethmoid air cells. Acute nondisplaced fractures involving the anterior and posterolateral walls of right maxillary sinus. Old left maxillary sinus and orbital floor fractures. Soft tissues: Moderate right periorbital hematoma with laceration. Limited intracranial: Small foci of intraparenchymal hemorrhage on the right. IMPRESSION: 1. Acute nondisplaced fractures involving the floor of right orbit, anterior and posterolateral walls of right maxillary sinus. Moderate right hemosinus. Small focus of intraorbital gas in the inferior right orbit. 2. Old left orbital and maxillary sinus fractures 3. Moderate right periorbital hematoma with laceration. 4. Acute foci of intraparenchymal hemorrhage within the right frontal lobe, see separately reported head CT. Electronically Signed   By: Jasmine Pang M.D.   On: 04/02/2023 18:50   CT CERVICAL SPINE WO CONTRAST  Result Date: 04/02/2023 CLINICAL DATA:  Mechanical fall EXAM: CT CERVICAL SPINE WITHOUT CONTRAST TECHNIQUE: Multidetector CT imaging of the cervical spine was performed without intravenous contrast. Multiplanar CT image reconstructions were also generated. RADIATION DOSE REDUCTION: This exam was performed  according to the departmental dose-optimization program which includes automated exposure control, adjustment of the mA and/or kV according to patient size and/or use of iterative reconstruction technique. COMPARISON:  None Available. FINDINGS: Alignment: No subluxation.  Facet alignment within normal limits. Skull base and vertebrae: No acute fracture. No primary bone lesion or focal pathologic process. Soft tissues and spinal canal: No prevertebral fluid or swelling. No visible canal hematoma. Disc levels: Multilevel degenerative change. Moderate severe disc space narrowing C5-C6 with bilateral foraminal narrowing Upper  chest: Negative. Other: None IMPRESSION: No CT evidence for acute osseous abnormality. Electronically Signed   By: Jasmine Pang M.D.   On: 04/02/2023 18:41   DG Chest Port 1 View  Result Date: 04/02/2023 CLINICAL DATA:  Trauma EXAM: PORTABLE CHEST 1 VIEW COMPARISON:  Chest x-ray December 01, 2021 FINDINGS: The cardiomediastinal silhouette is unchanged in contour. No focal pulmonary opacity. No pleural effusion or pneumothorax. The visualized upper abdomen is unremarkable. No acute osseous abnormality. IMPRESSION: No acute cardiopulmonary abnormality. Electronically Signed   By: Jacob Moores M.D.   On: 04/02/2023 18:11    ROS - all of the below systems have been reviewed with the patient and positives are indicated with bold text General: chills, fever or night sweats Eyes: blurry vision or double vision ENT: epistaxis or sore throat Allergy/Immunology: itchy/watery eyes or nasal congestion Hematologic/Lymphatic: bleeding problems, blood clots or swollen lymph nodes Endocrine: temperature intolerance or unexpected weight changes Breast: new or changing breast lumps or nipple discharge Resp: cough, shortness of breath, or wheezing CV: chest pain or dyspnea on exertion GI: as per HPI GU: dysuria, trouble voiding, or hematuria MSK: joint pain or joint stiffness Neuro: TIA or stroke  symptoms Derm: pruritus and skin lesion changes Psych: anxiety and depression  PE Blood pressure 139/84, pulse 72, temperature 99 F (37.2 C), temperature source Oral, resp. rate 17, height 5\' 4"  (1.626 m), weight 68 kg, SpO2 100 %. Physical Exam Constitutional: NAD; conversant; no deformities; Right orbit with periorbital ecchymoses.  Right forehead lack. GCS 15 Eyes: Moist conjunctiva; no lid lag Neck: Trachea midline Lungs: Normal respiratory effort; CTAB CV: RRR; no pitting edema GI: Abd soft, NT/ND MSK: Normal range of motion of extremities; no deformities Psychiatric: Appropriate affect; alert and oriented x3  Results for orders placed or performed during the hospital encounter of 04/02/23 (from the past 48 hour(s))  Comprehensive metabolic panel     Status: Abnormal   Collection Time: 04/02/23  5:39 PM  Result Value Ref Range   Sodium 139 135 - 145 mmol/L   Potassium 3.8 3.5 - 5.1 mmol/L   Chloride 107 98 - 111 mmol/L   CO2 23 22 - 32 mmol/L   Glucose, Bld 133 (H) 70 - 99 mg/dL    Comment: Glucose reference range applies only to samples taken after fasting for at least 8 hours.   BUN 16 8 - 23 mg/dL   Creatinine, Ser 1.61 0.44 - 1.00 mg/dL   Calcium 8.2 (L) 8.9 - 10.3 mg/dL   Total Protein 5.4 (L) 6.5 - 8.1 g/dL   Albumin 3.2 (L) 3.5 - 5.0 g/dL   AST 18 15 - 41 U/L   ALT 9 0 - 44 U/L   Alkaline Phosphatase 59 38 - 126 U/L   Total Bilirubin 0.5 0.3 - 1.2 mg/dL   GFR, Estimated >09 >60 mL/min    Comment: (NOTE) Calculated using the CKD-EPI Creatinine Equation (2021)    Anion gap 9 5 - 15    Comment: Performed at [facility], [address], [place], [place] [address]  CBC     Status: Abnormal   Collection Time: 04/02/23  5:39 PM  Result Value Ref Range   WBC 6.6 4.0 - 10.5 K/uL   RBC 3.42 (L) 3.87 - 5.11 MIL/uL   Hemoglobin 11.0 (L) 12.0 - 15.0 g/dL   HCT 81.1 (L) 91.4 - 78.2 %   MCV 99.1 80.0 - 100.0 fL   MCH 32.2 26.0 - 34.0 pg  MCHC 32.4 30.0 - 36.0  g/dL   RDW 16.1 09.6 - 04.5 %   Platelets 165 150 - 400 K/uL   nRBC 0.0 0.0 - 0.2 %    Comment: Performed at [facility], [address], [place], [place] [address]  Protime-INR     Status: None   Collection Time: 04/02/23  5:39 PM  Result Value Ref Range   Prothrombin Time 14.3 11.4 - 15.2 seconds   INR 1.1 0.8 - 1.2    Comment: (NOTE) INR goal varies based on device and disease states. Performed at [facility], [address] [place], [place] [address]   Sample to Blood Bank     Status: None   Collection Time: 04/02/23  5:39 PM  Result Value Ref Range   Blood Bank Specimen SAMPLE AVAILABLE FOR TESTING    Sample Expiration      04/05/2023,2359 Performed at [facility], [address] [place], [place] [address]   I-Stat Chem 8, ED     Status: Abnormal   Collection Time: 04/02/23  5:43 PM  Result Value Ref Range   Sodium 142 135 - 145 mmol/L   Potassium 3.7 3.5 - 5.1 mmol/L   Chloride 105 98 - 111 mmol/L   BUN 17 8 - 23 mg/dL   Creatinine, Ser 2.13 0.44 - 1.00 mg/dL   Glucose, Bld 086 (H) 70 - 99 mg/dL    Comment: Glucose reference range applies only to samples taken after fasting for at least 8 hours.   Calcium, Ion 1.10 (L) 1.15 - 1.40 mmol/L   TCO2 26 22 - 32 mmol/L   Hemoglobin 10.5 (L) 12.0 - 15.0 g/dL   HCT 57.8 (L) 46.9 - 62.9 %    CT HEAD WO CONTRAST  Result Date: 04/02/2023 CLINICAL DATA:  Head trauma, moderate-severe.  Fall. EXAM: CT HEAD WITHOUT CONTRAST TECHNIQUE: Contiguous axial images were obtained from the base of the skull through the vertex without intravenous contrast. RADIATION DOSE REDUCTION: This exam was performed according to the departmental dose-optimization program which includes automated exposure control, adjustment of the mA and/or kV according to patient size and/or use of iterative reconstruction technique. COMPARISON:  Head CT 12/01/2021 FINDINGS: Brain: There is scattered acute small to moderate volume subarachnoid  hemorrhage supratentorially, right greater than left and with the greatest amount of hemorrhage being in the posterior right sylvian fissure. Several small acute parenchymal hemorrhages are present in the subcortical [place] matter of the right frontal lobe and measure up to 1.1 cm. There is also a 3 mm subcortical parenchymal hemorrhage in the lateral right temporal lobe. A small subdural hematoma over the superior left cerebral convexity measures 3 mm in thickness. There is also minimal extra-axial hemorrhage along the falx. No midline shift or acute infarct is evident. Mild cerebral atrophy is within normal limits for age. Vascular: Calcified atherosclerosis at the skull base. Skull: No acute calvarial fracture. See separately reported maxillofacial CT. Sinuses/Orbits: See separately reported maxillofacial CT. Other: Right frontal scalp and periorbital hematomas. Critical Value/emergent results were called by telephone at the time of interpretation on 04/02/2023 at 6:58 pm to Dr. Alvino Blood, who verbally acknowledged these results. IMPRESSION: Acute traumatic intracranial hemorrhage including small to moderate volume subarachnoid hemorrhage, small subcortical parenchymal hemorrhages in the right frontal and right occipital lobes, and a small subdural hematoma over the left cerebral convexity. No intracranial mass effect. Electronically Signed   By: Sebastian Ache M.D.   On: 04/02/2023  18:58   CT Maxillofacial Wo Contrast  Result Date: 04/02/2023 CLINICAL DATA:  Facial trauma EXAM: CT MAXILLOFACIAL WITHOUT CONTRAST TECHNIQUE: Multidetector CT imaging of the maxillofacial structures was performed. Multiplanar CT image reconstructions were also generated. RADIATION DOSE REDUCTION: This exam was performed according to the departmental dose-optimization program which includes automated exposure control, adjustment of the mA and/or kV according to patient size and/or use of iterative reconstruction technique.  COMPARISON:  CT 12/01/2021 FINDINGS: Osseous: Trace fluid in the inferior right mastoid air cells. Mandibular heads are normally position. No mandibular fracture. Pterygoid plates and zygomatic arches appear intact. No acute nasal bone fracture. Orbits: Acute nondisplaced fracture floor of right orbit. Mild chronic deformity lateral wall right orbit. Small focus of intraorbital gas in the inferior right orbit. Sinuses: Moderate right hemosinus. Mild opacification of ethmoid air cells. Acute nondisplaced fractures involving the anterior and posterolateral walls of right maxillary sinus. Old left maxillary sinus and orbital floor fractures. Soft tissues: Moderate right periorbital hematoma with laceration. Limited intracranial: Small foci of intraparenchymal hemorrhage on the right. IMPRESSION: 1. Acute nondisplaced fractures involving the floor of right orbit, anterior and posterolateral walls of right maxillary sinus. Moderate right hemosinus. Small focus of intraorbital gas in the inferior right orbit. 2. Old left orbital and maxillary sinus fractures 3. Moderate right periorbital hematoma with laceration. 4. Acute foci of intraparenchymal hemorrhage within the right frontal lobe, see separately reported head CT. Electronically Signed   By: Jasmine Pang M.D.   On: 04/02/2023 18:50   CT CERVICAL SPINE WO CONTRAST  Result Date: 04/02/2023 CLINICAL DATA:  Mechanical fall EXAM: CT CERVICAL SPINE WITHOUT CONTRAST TECHNIQUE: Multidetector CT imaging of the cervical spine was performed without intravenous contrast. Multiplanar CT image reconstructions were also generated. RADIATION DOSE REDUCTION: This exam was performed according to the departmental dose-optimization program which includes automated exposure control, adjustment of the mA and/or kV according to patient size and/or use of iterative reconstruction technique. COMPARISON:  None Available. FINDINGS: Alignment: No subluxation.  Facet alignment within  normal limits. Skull base and vertebrae: No acute fracture. No primary bone lesion or focal pathologic process. Soft tissues and spinal canal: No prevertebral fluid or swelling. No visible canal hematoma. Disc levels: Multilevel degenerative change. Moderate severe disc space narrowing C5-C6 with bilateral foraminal narrowing Upper chest: Negative. Other: None IMPRESSION: No CT evidence for acute osseous abnormality. Electronically Signed   By: Jasmine Pang M.D.   On: 04/02/2023 18:41   DG Chest Port 1 View  Result Date: 04/02/2023 CLINICAL DATA:  Trauma EXAM: PORTABLE CHEST 1 VIEW COMPARISON:  Chest x-ray December 01, 2021 FINDINGS: The cardiomediastinal silhouette is unchanged in contour. No focal pulmonary opacity. No pleural effusion or pneumothorax. The visualized upper abdomen is unremarkable. No acute osseous abnormality. IMPRESSION: No acute cardiopulmonary abnormality. Electronically Signed   By: Jacob Moores M.D.   On: 04/02/2023 18:11      Assessment/Plan: 77yoF s/p ground level fall - tripped on sidewalk 04/02/23  SAH, small subcortical parenchymal hemorrhages on R, small on L - as per Dr. Jake Samples, consulted @ 18:30 - reviewed case with him - ok for progressive level of care. Ordered keppra ppx  R orbital floor fractures and of the wall of right maxillary sinus, moderate periorbital hematoma with laceration - as per Dr. Ulice Bold - consulted by Dr. Suezanne Jacquet @ 19:00 pm. Aliene Altes.  R facial/forehead lac- Dr. Suezanne Jacquet repairing in ED  PPX: SCDs; holding chemical dvt ppx in light of above. Dispo - admit  to progressive  All the above reviewed with her, her husband and son whom are present at bedside.  All questions were answered, expressed understanding and agreement with the plan.  I spent a total of 75 minutes in both face-to-face and non-face-to-face activities, excluding procedures performed, for this visit on the date of this encounter.  Marin Olp, MD [facility], [facility]

## 2023-04-02 NOTE — Progress Notes (Signed)
  I have personally reviewed this case in consultation for tSAH and contusions.  My management recommendation is for repeat CT imaging in 12 hours  If the patient is admitted to the hospital,   - from a neurosurgical perspective, this patient may be started on DVT prophylaxis with: subcutaneous heparin to start 24h from stable repeat head CT  - after initiation of DVT prophylaxis, I recommend the following additional neurosurgical imaging: none  If the patient was on a therapeutic anticoagulation and/or anti-platelet therapy pre-injury, it may be resumed  2 weeks after injury.   Full consult to follow    Time of consult: 610pm Time of evaluation: 620pm imaging review    Alan Mulder Tanner Yeley

## 2023-04-02 NOTE — ED Triage Notes (Signed)
Pt to ED via EMS fafter a mechanical fall from standing. Per EMS pt was walking out of department store when she tripped on the sidewalk and hit her head on the concrete. Per EMS family stated pt laid on the ground for a while and was hard to arouse. EMS reported initial GCS of 15 on their arrival. Pt has small laceration to right side of head with controlled bleeding. Hematoma to right eye. Pt Aox4 in triage, c/o right eye pain and headache. Pt denies any other injuries, or n/v. Not on thinners. Trauma team and MD at bedside.

## 2023-04-02 NOTE — ED Notes (Signed)
Trauma Response Nurse Documentation   Cheryl Harrington is a 77 y.o. female arriving to [facility] via EMS  On aspirin 81 mg daily. Trauma was activated as a Level 2 by [place] [facility] Charge RN based on the following trauma criteria GCS 10-14 associated with trauma or AVPU < A.  Patient cleared for CT by Dr. Suezanne Harrington. Pt transported to CT with trauma response nurse present to monitor. RN remained with the patient throughout their absence from the department for clinical observation.   GCS 14.  History   Past Medical History:  Diagnosis Date   Basal cell carcinoma of skin 12/14/2022   Had basal cell carcinoma excised from right labia in12/2014 and then further treated with additional excision and cryo 10/2012 per Dr. Tenny Harrington note. Path report indicated positive peripheral and deep edges. Colpo for completeness 12/2015 - no bx needed. Per pt and confirmed with exam - area of prior biopsy R anterior labia. No residual skin changes noted.   Closed fracture of left orbital floor with routine healing 12/09/2021   Closed fracture of maxillary sinus (HCC) 12/09/2021   Elevated cholesterol    Fall 12/02/2021   GERD (gastroesophageal reflux disease)    Heel cord tightness, right 01/14/2020   Impaired mobility and ADLs 12/14/2022   Menopause present 12/14/2022   Osteopenia 12/14/2022   dx 2004- Actonel/Fosamax x 41yrs, followed by PCP, last dexa 2017 - per pt PCP has restarted her on bisphosphnate med   Osteoporosis    Plantar fasciitis 01/14/2020   Urinary incontinence 12/14/2022   occasional stress UI- discussed causes and reviewed mgmt options with pt 11/2015- declines intervention at this time, still declining PT etc at 2018 visit.     Past Surgical History:  Procedure Laterality Date   CATARACT EXTRACTION Bilateral 2007   CHOLECYSTECTOMY  2000   INGUINAL HERNIA REPAIR  1997     Initial Focused Assessment (If applicable, or please see trauma documentation): - Airway intact - Breath sounds equal and  clear bilaterally - R pupil 4mm brisk - L pupil 3mm brisk - MAE equally - Equal sensation in all 4 extremities  - 1cm lac to R eye - oozing blood and bandaged by EMS - C-collar in place - 16G PIVs to bilateral ACs  CT's Completed:   CT Head, CT Maxillofacial, and CT C-Spine   Interventions:  - CXR - CT head, max face and c-spine - 0.5mg  Dilauded given - 4mg  zofran given - 1g keppra given - neurosurgery consulted - ENT consulted  - Cleaned face off and cleansed wound with sterile water and NS. - Suturing lac at bedside by EDP - Updated family at bedside  Plan for disposition:  Admission to [facility]   Consults completed:  Neurosurgeon at 1802. EDP called Dr [facility] on the phone. ENT paged at 1858.  Event Summary: Pt was walking in [place] with her husband when she walked quickly ahead of him, caught her toe on the curb and fell face first into the curb.  Pt was dizzy and confused post fall; possible LOC.  Another witness stated she may have had seizure-like activity temporarily.  According to another chart she had a fall like this and was admitted to [facility] for similar injuries a little over a year ago. Takes a baby aspirin, otherwise no other blood thinners.  Pt's husband and son at bedside.   Bedside handoff with ED RN [facility].    Cheryl Harrington  Trauma Response RN  Please call  TRN at [phone] for further assistance.

## 2023-04-02 NOTE — TOC CAGE-AID Note (Signed)
Transition of Care [facility]) - CAGE-AID Screening   Patient Details  Name: Cheryl Harrington MRN: [id number] Date of Birth: 25-Jul-1946  Transition of Care [facility]) CM/SW Contact:    Katha Hamming, RN Phone Number: 04/02/2023, 7:38 PM   Clinical Narrative:  Denies drug/alcohol use  CAGE-AID Screening:    Have You Ever Felt You Ought to Cut Down on Your Drinking or Drug Use?: No Have People Annoyed You By Critizing Your Drinking Or Drug Use?: No Have You Felt Bad Or Guilty About Your Drinking Or Drug Use?: No Have You Ever Had a Drink or Used Drugs First Thing In The Morning to Steady Your Nerves or to Get Rid of a Hangover?: No CAGE-AID Score: 0  Substance Abuse Education Offered: No

## 2023-04-02 NOTE — ED Notes (Signed)
Pt and family requests that Kipp Brood (family member - son) be notified when she gets a bed assignment. See patient contacts.

## 2023-04-02 NOTE — ED Provider Notes (Signed)
[facility] AT [facility] Provider Note  CSN: [id number] Arrival date & time: 04/02/23 1727  Chief Complaint(s) Fall  HPI Cheryl Harrington is a 77 y.o. female who denies any significant past medical history presenting to the emergency department with head injury.  Per EMS, the patient tripped, landed on the head.  Patient was apparently somnolent afterwards, seemed to have episode of possible seizure activity or shaking.  Per EMS, on arrival the patient was awake and alert.  She has been amnestic to the event.  The patient in the emergency department does not remember what happened but reports she has some pain around her right face and mild headache, no nausea or vomiting.  She denies any neck or back pain.  She denies any extremity pain or chest or abdominal pain.  She denies any blood thinner usage.   Past Medical History Past Medical History:  Diagnosis Date   Basal cell carcinoma of skin 12/14/2022   Had basal cell carcinoma excised from right labia in12/2014 and then further treated with additional excision and cryo 10/2012 per Dr. Tenny Craw note. Path report indicated positive peripheral and deep edges. Colpo for completeness 12/2015 - no bx needed. Per pt and confirmed with exam - area of prior biopsy R anterior labia. No residual skin changes noted.   Closed fracture of left orbital floor with routine healing 12/09/2021   Closed fracture of maxillary sinus (HCC) 12/09/2021   Elevated cholesterol    Fall 12/02/2021   GERD (gastroesophageal reflux disease)    Heel cord tightness, right 01/14/2020   Impaired mobility and ADLs 12/14/2022   Menopause present 12/14/2022   Osteopenia 12/14/2022   dx 2004- Actonel/Fosamax x 42yrs, followed by PCP, last dexa 2017 - per pt PCP has restarted her on bisphosphnate med   Osteoporosis    Plantar fasciitis 01/14/2020   Urinary incontinence 12/14/2022   occasional stress UI- discussed causes and reviewed mgmt options  with pt 11/2015- declines intervention at this time, still declining PT etc at 2018 visit.   Patient Active Problem List   Diagnosis Date Noted   Fall from standing 04/02/2023   Osteoporosis 02/01/2023   GERD (gastroesophageal reflux disease) 02/01/2023   Elevated cholesterol 02/01/2023   Basal cell carcinoma of skin 12/14/2022   Impaired mobility and ADLs 12/14/2022   Menopause present 12/14/2022   Osteopenia 12/14/2022   Urinary incontinence 12/14/2022   Closed fracture of left orbital floor with routine healing 12/09/2021   Closed fracture of maxillary sinus (HCC) 12/09/2021   Fall 12/02/2021   Heel cord tightness, right 01/14/2020   Plantar fasciitis 01/14/2020   Home Medication(s) Prior to Admission medications   Medication Sig Start Date End Date Taking? Authorizing Provider  acetaminophen (TYLENOL) 325 MG tablet Take 325 mg by mouth every 6 (six) hours as needed for mild pain or moderate pain. 12/02/21   [provider]  ascorbic acid (VITAMIN C) 500 MG tablet Take 500 mg by mouth daily. 12/01/21   [provider]  aspirin EC 81 MG tablet Take 81 mg by mouth daily. Swallow whole.    [provider]  calcium carbonate (SUPER CALCIUM) 1500 (600 Ca) MG TABS tablet Take 2 tablets by mouth daily. 12/01/21   [provider]  citalopram (CELEXA) 10 MG tablet Take 10 mg by mouth daily. 01/10/15   [provider]  denosumab (PROLIA) 60 MG/ML SOSY injection Inject 60 mg into the skin every 6 (six) months. 02/13/20   [provider]  metoprolol tartrate (LOPRESSOR) 100 MG tablet Take 1 tablet (100 mg total) by mouth once for 1 dose. Please take this medication 2 hours before CT. 02/03/23 02/03/23  Baldo Daub, MD  Multiple Vitamin (MULTI-VITAMIN) tablet Take 1 tablet by mouth daily. 01/14/20   [provider]  omeprazole (PRILOSEC) 40 MG capsule Take 40 mg by mouth daily. 01/10/15   [provider]  rosuvastatin (CRESTOR) 10 MG  tablet Take 10 mg by mouth daily. 01/10/15   [provider]                                                                                                                                    Past Surgical History Past Surgical History:  Procedure Laterality Date   CATARACT EXTRACTION Bilateral 2007   CHOLECYSTECTOMY  2000   INGUINAL HERNIA REPAIR  1997   Family History Family History  Problem Relation Age of Onset   Multiple myeloma Mother    Congestive Heart Failure Mother    Heart attack Mother    Colon cancer Father    Heart attack Brother 69   Stroke Maternal Grandmother    Stroke Paternal Grandmother     Social History Social History   Tobacco Use   Smoking status: Never   Smokeless tobacco: Never  Vaping Use   Vaping Use: Never used  Substance Use Topics   Alcohol use: Never   Drug use: Never   Allergies Patient has no known allergies.  Review of Systems Review of Systems  All other systems reviewed and are negative.   Physical Exam Vital Signs  I have reviewed the triage vital signs BP (!) 145/82   Pulse 67   Temp 99 F (37.2 C) (Oral)   Resp 17   Ht 5\' 4"  (1.626 m)   Wt 68 kg   SpO2 100%   BMI 25.75 kg/m  Physical Exam Vitals and nursing note reviewed.  Constitutional:      General: She is not in acute distress.    Appearance: She is well-developed.  HENT:     Head: Normocephalic.     Comments: Large periorbital ecchymosis with approximately 2 cm laceration to the right temple region    Mouth/Throat:     Mouth: Mucous membranes are moist.  Eyes:     Extraocular Movements: Extraocular movements intact.     Pupils: Pupils are equal, round, and reactive to light.     Comments: Vision grossly intact  Cardiovascular:     Rate and Rhythm: Normal rate and regular rhythm.     Heart sounds: No murmur heard. Pulmonary:     Effort: Pulmonary effort is normal. No respiratory distress.     Breath sounds: Normal breath sounds.  Abdominal:      General: Abdomen is flat.     Palpations: Abdomen is soft.     Tenderness: There is no abdominal tenderness.  Musculoskeletal:        General: No tenderness.     Right lower leg: No edema.     Left lower leg: No edema.     Comments: No midline C, T, L-spine tenderness, full active range of motion of the bilateral upper and lower extremities without focal tenderness/deformity.  Skin:    General: Skin is warm and dry.  Neurological:     General: No focal deficit present.     Mental Status: She is alert and oriented to person, place, and time. Mental status is at baseline.     Comments: Amnestic to event, GCS 15  Psychiatric:        Mood and Affect: Mood normal.        Behavior: Behavior normal.     ED Results and Treatments Labs (all labs ordered are listed, but only abnormal results are displayed) Labs Reviewed  COMPREHENSIVE METABOLIC PANEL - Abnormal; Notable for the following components:      Result Value   Glucose, Bld 133 (*)    Calcium 8.2 (*)    Total Protein 5.4 (*)    Albumin 3.2 (*)    All other components within normal limits  CBC - Abnormal; Notable for the following components:   RBC 3.42 (*)    Hemoglobin 11.0 (*)    HCT 33.9 (*)    All other components within normal limits  I-STAT CHEM 8, ED - Abnormal; Notable for the following components:   Glucose, Bld 130 (*)    Calcium, Ion 1.10 (*)    Hemoglobin 10.5 (*)    HCT 31.0 (*)    All other components within normal limits  PROTIME-INR  ETHANOL  LACTIC ACID, PLASMA  SAMPLE TO BLOOD BANK                                                                                                                          Radiology CT HEAD WO CONTRAST  Result Date: 04/02/2023 CLINICAL DATA:  Head trauma, moderate-severe.  Fall. EXAM: CT HEAD WITHOUT CONTRAST TECHNIQUE: Contiguous axial images were obtained from the base of the skull through the vertex without intravenous contrast. RADIATION DOSE REDUCTION: This exam was  performed according to the departmental dose-optimization program which includes automated exposure control, adjustment of the mA and/or kV according to patient size and/or use of iterative reconstruction technique. COMPARISON:  Head CT 12/01/2021 FINDINGS: Brain: There is scattered acute small to moderate volume subarachnoid hemorrhage supratentorially, right greater than left and with the greatest amount of hemorrhage being in the posterior right sylvian fissure. Several small acute parenchymal hemorrhages are present in the subcortical white matter of the right frontal lobe and measure up to 1.1 cm. There is also a 3 mm subcortical parenchymal hemorrhage in the lateral right temporal lobe. A small subdural hematoma over the superior left cerebral convexity measures 3 mm in thickness. There is also minimal extra-axial hemorrhage along the falx. No midline shift or acute infarct  is evident. Mild cerebral atrophy is within normal limits for age. Vascular: Calcified atherosclerosis at the skull base. Skull: No acute calvarial fracture. See separately reported maxillofacial CT. Sinuses/Orbits: See separately reported maxillofacial CT. Other: Right frontal scalp and periorbital hematomas. Critical Value/emergent results were called by telephone at the time of interpretation on 04/02/2023 at 6:58 pm to Dr. Alvino Blood, who verbally acknowledged these results. IMPRESSION: Acute traumatic intracranial hemorrhage including small to moderate volume subarachnoid hemorrhage, small subcortical parenchymal hemorrhages in the right frontal and right occipital lobes, and a small subdural hematoma over the left cerebral convexity. No intracranial mass effect. Electronically Signed   By: Sebastian Ache M.D.   On: 04/02/2023 18:58   CT Maxillofacial Wo Contrast  Result Date: 04/02/2023 CLINICAL DATA:  Facial trauma EXAM: CT MAXILLOFACIAL WITHOUT CONTRAST TECHNIQUE: Multidetector CT imaging of the maxillofacial structures was  performed. Multiplanar CT image reconstructions were also generated. RADIATION DOSE REDUCTION: This exam was performed according to the departmental dose-optimization program which includes automated exposure control, adjustment of the mA and/or kV according to patient size and/or use of iterative reconstruction technique. COMPARISON:  CT 12/01/2021 FINDINGS: Osseous: Trace fluid in the inferior right mastoid air cells. Mandibular heads are normally position. No mandibular fracture. Pterygoid plates and zygomatic arches appear intact. No acute nasal bone fracture. Orbits: Acute nondisplaced fracture floor of right orbit. Mild chronic deformity lateral wall right orbit. Small focus of intraorbital gas in the inferior right orbit. Sinuses: Moderate right hemosinus. Mild opacification of ethmoid air cells. Acute nondisplaced fractures involving the anterior and posterolateral walls of right maxillary sinus. Old left maxillary sinus and orbital floor fractures. Soft tissues: Moderate right periorbital hematoma with laceration. Limited intracranial: Small foci of intraparenchymal hemorrhage on the right. IMPRESSION: 1. Acute nondisplaced fractures involving the floor of right orbit, anterior and posterolateral walls of right maxillary sinus. Moderate right hemosinus. Small focus of intraorbital gas in the inferior right orbit. 2. Old left orbital and maxillary sinus fractures 3. Moderate right periorbital hematoma with laceration. 4. Acute foci of intraparenchymal hemorrhage within the right frontal lobe, see separately reported head CT. Electronically Signed   By: Jasmine Pang M.D.   On: 04/02/2023 18:50   CT CERVICAL SPINE WO CONTRAST  Result Date: 04/02/2023 CLINICAL DATA:  Mechanical fall EXAM: CT CERVICAL SPINE WITHOUT CONTRAST TECHNIQUE: Multidetector CT imaging of the cervical spine was performed without intravenous contrast. Multiplanar CT image reconstructions were also generated. RADIATION DOSE REDUCTION:  This exam was performed according to the departmental dose-optimization program which includes automated exposure control, adjustment of the mA and/or kV according to patient size and/or use of iterative reconstruction technique. COMPARISON:  None Available. FINDINGS: Alignment: No subluxation.  Facet alignment within normal limits. Skull base and vertebrae: No acute fracture. No primary bone lesion or focal pathologic process. Soft tissues and spinal canal: No prevertebral fluid or swelling. No visible canal hematoma. Disc levels: Multilevel degenerative change. Moderate severe disc space narrowing C5-C6 with bilateral foraminal narrowing Upper chest: Negative. Other: None IMPRESSION: No CT evidence for acute osseous abnormality. Electronically Signed   By: Jasmine Pang M.D.   On: 04/02/2023 18:41   DG Chest Port 1 View  Result Date: 04/02/2023 CLINICAL DATA:  Trauma EXAM: PORTABLE CHEST 1 VIEW COMPARISON:  Chest x-ray December 01, 2021 FINDINGS: The cardiomediastinal silhouette is unchanged in contour. No focal pulmonary opacity. No pleural effusion or pneumothorax. The visualized upper abdomen is unremarkable. No acute osseous abnormality. IMPRESSION: No acute cardiopulmonary abnormality. Electronically Signed  By: Jacob Moores M.D.   On: 04/02/2023 18:11    Pertinent labs & imaging results that were available during my care of the patient were reviewed by me and considered in my medical decision making (see MDM for details).  Medications Ordered in ED Medications  lidocaine (PF) (XYLOCAINE) 1 % injection 5 mL (has no administration in time range)  Ampicillin-Sulbactam (UNASYN) 3 g in sodium chloride 0.9 % 100 mL IVPB (3 g Intravenous New Bag/Given 04/02/23 1903)  HYDROmorphone (DILAUDID) injection 0.5 mg (0.5 mg Intravenous Given 04/02/23 1832)  levETIRAcetam (KEPPRA) IVPB 1000 mg/100 mL premix (0 mg Intravenous Stopped 04/02/23 1859)  ondansetron (ZOFRAN) injection 4 mg (4 mg Intravenous Given  04/02/23 1830)                                                                                                                                     Procedures .Critical Care  Performed by: Lonell Grandchild, MD Authorized by: Lonell Grandchild, MD   Critical care provider statement:    Critical care time (minutes):  30   Critical care was necessary to treat or prevent imminent or life-threatening deterioration of the following conditions:  Trauma and CNS failure or compromise   Critical care was time spent personally by me on the following activities:  Development of treatment plan with patient or surrogate, discussions with consultants, evaluation of patient's response to treatment, examination of patient, ordering and review of laboratory studies, ordering and review of radiographic studies, ordering and performing treatments and interventions, pulse oximetry, re-evaluation of patient's condition and review of old charts   Care discussed with: admitting provider   .Marland KitchenLaceration Repair  Date/Time: 04/02/2023 7:18 PM  Performed by: Lonell Grandchild, MD Authorized by: Lonell Grandchild, MD   Consent:    Consent obtained:  Verbal   Consent given by:  Patient   Risks, benefits, and alternatives were discussed: yes     Risks discussed:  Infection, need for additional repair, nerve damage, poor wound healing, poor cosmetic result, pain, retained foreign body, tendon damage and vascular damage   Alternatives discussed:  No treatment Universal protocol:    Patient identity confirmed:  Verbally with patient and arm band Anesthesia:    Anesthesia method:  Local infiltration   Local anesthetic:  Lidocaine 1% w/o epi Laceration details:    Location:  Face   Face location:  R upper eyelid   Length (cm):  2 Exploration:    Wound exploration: wound explored through full range of motion and entire depth of wound visualized     Wound extent: areolar tissue not violated, fascia not violated,  no foreign body, no signs of injury, no nerve damage, no tendon damage, no underlying fracture and no vascular damage     Contaminated: no   Treatment:    Area cleansed with:  Saline   Amount of cleaning:  Standard   Irrigation  solution:  Sterile saline   Irrigation method:  Syringe   Visualized foreign bodies/material removed: no     Debridement:  None   Undermining:  None   Scar revision: no   Skin repair:    Repair method:  Sutures   Suture size:  5-0   Suture material:  Fast-absorbing gut   Suture technique:  Simple interrupted Repair type:    Repair type:  Simple Post-procedure details:    Dressing:  Open (no dressing)   Procedure completion:  Tolerated well, no immediate complications   (including critical care time)  Medical Decision Making / ED Course   MDM:  77 year old female presenting to the emergency department head injury.  Patient overall well-appearing, does have signs of head injury on exam.  She denies any eye pain and reports her vision seems normal, doubt ocular trauma.  She is A&O x 3 with GCS 15.  Given nature of injury, CT head as well as CT neck and CT maxillofacial were obtained.  CT head on my interpretation shows subarachnoid hemorrhage more right than left as well as possible right frontal parenchymal hemorrhage.  Discussed with neurosurgeon Dr. Jake Samples who recommends Keppra, will need observation and repeat head CT.  CT maxillofacial pending to evaluate for evidence of facial injury.  If patient has other injury such as facial fracture may need trauma evaluation and admission, otherwise Dr. Jake Samples would be happy to admit the patient for further observation.  Will repair laceration.  Patient reports her tetanus is up-to-date.  Clinical Course as of 04/02/23 Cathie Olden Apr 02, 2023  1610 Discussed with Dr. Jake Samples who recommends keppra  [WS]  1916 Discussed with Dr. Cliffton Asters who will admit to trauma. Requests Unasyn for sinus fracture. Discussed with Dr.  Adrienne Mocha  [WS]    Clinical Course User Index [WS] Suezanne Jacquet Jerilee Field, MD     Additional history obtained: -Additional history obtained from family and ems -External records from outside source obtained and reviewed including: Chart review including previous notes, labs, imaging, consultation notes including prior [facility] notes from previous fall   Lab Tests: -I ordered, reviewed, and interpreted labs.   The pertinent results include:   Labs Reviewed  COMPREHENSIVE METABOLIC PANEL - Abnormal; Notable for the following components:      Result Value   Glucose, Bld 133 (*)    Calcium 8.2 (*)    Total Protein 5.4 (*)    Albumin 3.2 (*)    All other components within normal limits  CBC - Abnormal; Notable for the following components:   RBC 3.42 (*)    Hemoglobin 11.0 (*)    HCT 33.9 (*)    All other components within normal limits  I-STAT CHEM 8, ED - Abnormal; Notable for the following components:   Glucose, Bld 130 (*)    Calcium, Ion 1.10 (*)    Hemoglobin 10.5 (*)    HCT 31.0 (*)    All other components within normal limits  PROTIME-INR  ETHANOL  LACTIC ACID, PLASMA  SAMPLE TO BLOOD BANK    Notable for mild anemia. No AKI. Normal PT/INR  EKG     Imaging Studies ordered: I ordered imaging studies including CT head/Face/C spine On my interpretation imaging demonstrates multiple orbital fractures, multicompartment ICH I independently visualized and interpreted imaging. I agree with the radiologist interpretation   Medicines ordered and prescription drug management: Meds ordered this encounter  Medications   lidocaine (PF) (XYLOCAINE) 1 % injection 5 mL  DISCONTD: acetaminophen (TYLENOL) tablet 1,000 mg   HYDROmorphone (DILAUDID) injection 0.5 mg   levETIRAcetam (KEPPRA) IVPB 1000 mg/100 mL premix   ondansetron (ZOFRAN) injection 4 mg   Ampicillin-Sulbactam (UNASYN) 3 g in sodium chloride 0.9 % 100 mL IVPB    Order Specific Question:   Antibiotic  Indication:    Answer:   Other Indication (list below)    -I have reviewed the patients home medicines and have made adjustments as needed   Consultations Obtained: I requested consultation with the neurosurgeon, trauma surgeon, and plastic surgeon,  and discussed lab and imaging findings as well as pertinent plan - they recommend: admit trauma    Cardiac Monitoring: The patient was maintained on a cardiac monitor.  I personally viewed and interpreted the cardiac monitored which showed an underlying rhythm of: NSR  Reevaluation: After the interventions noted above, I reevaluated the patient and found that their symptoms have improved  Co morbidities that complicate the patient evaluation  Past Medical History:  Diagnosis Date   Basal cell carcinoma of skin 12/14/2022   Had basal cell carcinoma excised from right labia in12/2014 and then further treated with additional excision and cryo 10/2012 per Dr. Tenny Craw note. Path report indicated positive peripheral and deep edges. Colpo for completeness 12/2015 - no bx needed. Per pt and confirmed with exam - area of prior biopsy R anterior labia. No residual skin changes noted.   Closed fracture of left orbital floor with routine healing 12/09/2021   Closed fracture of maxillary sinus (HCC) 12/09/2021   Elevated cholesterol    Fall 12/02/2021   GERD (gastroesophageal reflux disease)    Heel cord tightness, right 01/14/2020   Impaired mobility and ADLs 12/14/2022   Menopause present 12/14/2022   Osteopenia 12/14/2022   dx 2004- Actonel/Fosamax x 16yrs, followed by PCP, last dexa 2017 - per pt PCP has restarted her on bisphosphnate med   Osteoporosis    Plantar fasciitis 01/14/2020   Urinary incontinence 12/14/2022   occasional stress UI- discussed causes and reviewed mgmt options with pt 11/2015- declines intervention at this time, still declining PT etc at 2018 visit.      Dispostion: Disposition decision including need for hospitalization  was considered, and patient admitted to the hospital.    Final Clinical Impression(s) / ED Diagnoses Final diagnoses:  Traumatic intracerebral hemorrhage with loss of consciousness of 30 minutes or less, unspecified laterality, initial encounter (HCC)  Closed fracture of facial bone, unspecified facial bone, initial encounter (HCC)  Facial laceration, initial encounter     This chart was dictated using voice recognition software.  Despite best efforts to proofread,  errors can occur which can change the documentation meaning.    Lonell Grandchild, MD 04/02/23 [phone]

## 2023-04-03 ENCOUNTER — Other Ambulatory Visit (HOSPITAL_COMMUNITY): Payer: Self-pay

## 2023-04-03 ENCOUNTER — Inpatient Hospital Stay (HOSPITAL_COMMUNITY): Payer: PPO

## 2023-04-03 ENCOUNTER — Telehealth (HOSPITAL_COMMUNITY): Payer: Self-pay | Admitting: Pharmacy Technician

## 2023-04-03 LAB — CBC
HCT: 36.5 % (ref 36.0–46.0)
Hemoglobin: 11.8 g/dL — ABNORMAL LOW (ref 12.0–15.0)
MCH: 31.6 pg (ref 26.0–34.0)
MCHC: 32.3 g/dL (ref 30.0–36.0)
MCV: 97.6 fL (ref 80.0–100.0)
Platelets: 185 10*3/uL (ref 150–400)
RBC: 3.74 MIL/uL — ABNORMAL LOW (ref 3.87–5.11)
RDW: 12.7 % (ref 11.5–15.5)
WBC: 9 10*3/uL (ref 4.0–10.5)
nRBC: 0 % (ref 0.0–0.2)

## 2023-04-03 LAB — BASIC METABOLIC PANEL
Anion gap: 8 (ref 5–15)
BUN: 12 mg/dL (ref 8–23)
CO2: 26 mmol/L (ref 22–32)
Calcium: 8.5 mg/dL — ABNORMAL LOW (ref 8.9–10.3)
Chloride: 105 mmol/L (ref 98–111)
Creatinine, Ser: 0.54 mg/dL (ref 0.44–1.00)
GFR, Estimated: >60 mL/min (ref >60)
Glucose, Bld: 138 mg/dL — ABNORMAL HIGH (ref 70–99)
Potassium: 3.6 mmol/L (ref 3.5–5.1)
Sodium: 139 mmol/L (ref 135–145)

## 2023-04-03 MED ORDER — ONDANSETRON 4 MG PO TBDP
4.0000 mg | ORAL_TABLET | Freq: Four times a day (QID) | ORAL | 0 refills | Status: DC | PRN
  Filled 2023-04-03: qty 20, 5d supply, fill #0

## 2023-04-03 MED ORDER — OXYCODONE HCL 5 MG PO TABS
2.5000 mg | ORAL_TABLET | Freq: Four times a day (QID) | ORAL | 0 refills | Status: DC | PRN
  Filled 2023-04-03: qty 10, 3d supply, fill #0

## 2023-04-03 MED ORDER — LEVETIRACETAM 500 MG PO TABS
500.0000 mg | ORAL_TABLET | Freq: Two times a day (BID) | ORAL | 0 refills | Status: DC
  Filled 2023-04-03: qty 12, 6d supply, fill #0

## 2023-04-03 MED ORDER — AMOXICILLIN-POT CLAVULANATE 875-125 MG PO TABS
1.0000 | ORAL_TABLET | Freq: Two times a day (BID) | ORAL | 0 refills | Status: AC
  Filled 2023-04-03: qty 8, 4d supply, fill #0

## 2023-04-03 MED ORDER — ASPIRIN 81 MG PO TBEC: 81.0000 mg | DELAYED_RELEASE_TABLET | Freq: Every day | ORAL | Status: AC

## 2023-04-03 NOTE — Evaluation (Signed)
Physical Therapy Evaluation Patient Details Name: Cheryl Harrington MRN: [id number] DOB: 03/02/1946 Today's Date: 04/03/2023  History of Present Illness  Patient is 77 y.o. female who presented as a level 2 trauma due to tripping and falling onto her head yesterday. She is amnestic to the event. She denies any seizure-like activity, c/o a small headache, denies numbness/tingling or weakness, denies any vision changes, denies any bowel or bladder incontinence. Imaging in ED revealed SAH/SDH/small intraparenchymal hemorrhages, Rt orbital floor and maxillary sinus fractures. PMH significant for GERD, osteoporosis, falls.   Clinical Impression  Cheryl Harrington is 77 y.o. female admitted with above HPI and diagnosis. Patient is currently limited by functional impairments below (see PT problem list). Patient lives with spouse and is independent at baseline. Currently pt requires min guard/supervision with bed mobility and min guard/assist for transfers with RW. Pt amb 2x50' with RW today and required min assist for walker position, posture, balance, and for safe hallway navigation. Pt experienced bout of emesis after sit<>stand, and reports the nausea was sudden. Suspect pt may have vestibular involvement though no nystagmus was noted and testing deferred as pt eager to amb to bathroom. Patient will benefit from continued skilled PT interventions to address impairments and progress independence with mobility. Acute PT will follow and progress as able.      Orthostatic VS for the past 24 hrs:  BP- Lying Pulse- Lying BP- Sitting Pulse- Sitting BP- Standing at 0 minutes Pulse- Standing at 0 minutes BP- Standing at 3 minutes (post gait) Pulse- Standing at 3 minutes (post gait)  04/03/23 1025 114/64 54 119/71 62 115/69 70 125/65 84       Recommendations for follow up therapy are one component of a multi-disciplinary discharge planning process, led by the attending physician.  Recommendations may be updated  based on patient status, additional functional criteria and insurance authorization.  Follow Up Recommendations       Assistance Recommended at Discharge Frequent or constant Supervision/Assistance  Patient can return home with the following  A little help with walking and/or transfers;A little help with bathing/dressing/bathroom;Assistance with cooking/housework;Direct supervision/assist for medications management;Assist for transportation;Help with stairs or ramp for entrance    Equipment Recommendations None recommended by PT  Recommendations for Other Services       Functional Status Assessment Patient has had a recent decline in their functional status and demonstrates the ability to make significant improvements in function in a reasonable and predictable amount of time.     Precautions / Restrictions Precautions Precautions: Fall Restrictions Weight Bearing Restrictions: No      Mobility  Bed Mobility Overal bed mobility: Needs Assistance Bed Mobility: Supine to Sit, Sit to Supine     Supine to sit: Supervision, HOB elevated, Min guard Sit to supine: Supervision, HOB elevated   General bed mobility comments: use of bed features, guard/supervision for safety. pt returned to supine even though directed to remain sitting EOB for BP assessment.    Transfers Overall transfer level: Needs assistance Equipment used: Rolling walker (2 wheels) Transfers: Sit to/from Stand Sit to Stand: Min guard, From elevated surface, Min assist (stretcher)           General transfer comment: guarding for safety with sit<>stand from EOB and toilet with cues for safe pivot and assist to guide lower to toilet. pt's BP stable with sit<>stand. Pt with bout of emesis following initiate sit<>stand from EOB and RN provided nausea meds.    Ambulation/Gait Ambulation/Gait assistance: Editor, commissioning (  Feet): 50 Feet (2x50) Assistive device: Rolling walker (2 wheels) Gait  Pattern/deviations: Step-through pattern, Decreased step length - right, Decreased step length - left, Decreased stride length, Shuffle, Trunk flexed, Narrow base of support Gait velocity: decr     General Gait Details: pt with shuffled short steps with gait, poor navigation ability requriring repeated cues (possibly due to hearing) for hallway ambulation. pt with slightly flat affect and repeated "fine" when asked if experiencing symptoms during gait/mobility.  Stairs            Wheelchair Mobility    Modified Rankin (Stroke Patients Only)       Balance Overall balance assessment: Needs assistance Sitting-balance support: Feet supported Sitting balance-Leahy Scale: Fair     Standing balance support: During functional activity, Reliant on assistive device for balance, Bilateral upper extremity supported Standing balance-Leahy Scale: Poor                               Pertinent Vitals/Pain      Home Living Family/patient expects to be discharged to:: Private residence Living Arrangements: Spouse/significant other (lives with husband, son lives in house behind pt's home) Available Help at Discharge: Family Type of Home: House Home Access: Stairs to enter Entrance Stairs-Rails: None Entrance Stairs-Number of Steps: 1   Home Layout: One level Home Equipment: Agricultural consultant (2 wheels);Cane - single point;Shower seat      Prior Function Prior Level of Function : Independent/Modified Independent;Driving;Working/employed                     Hand Dominance   Dominant Hand: Right    Extremity/Trunk Assessment   Upper Extremity Assessment Upper Extremity Assessment: Defer to OT evaluation    Lower Extremity Assessment Lower Extremity Assessment: Generalized weakness    Cervical / Trunk Assessment Cervical / Trunk Assessment: Kyphotic  Communication   Communication: HOH  Cognition Arousal/Alertness: Awake/alert Behavior During Therapy: Flat  affect, Impulsive (silghlty flat and not forthcoming with symptoms; pt may just be a quick mover at baseline) Overall Cognitive Status: Impaired/Different from baseline Area of Impairment: Safety/judgement, Awareness                         Safety/Judgement: Decreased awareness of deficits Awareness: Emergent   General Comments: pt with overall decreased awarenss of deficits and safety concerns related to recent fall. slighlty impulsive though pt may be fast mover at baseline due to high independence level at baseline. pt has decreased ability to make needs known and despite feeling nauseous unable to state symptoms even when prompted by therapist.        General Comments      Exercises     Assessment/Plan    PT Assessment Patient needs continued PT services  PT Problem List Decreased strength;Decreased activity tolerance;Decreased balance;Decreased mobility;Decreased cognition;Decreased knowledge of use of DME;Decreased safety awareness;Decreased knowledge of precautions;Impaired sensation       PT Treatment Interventions DME instruction;Patient/family education;Cognitive remediation;Neuromuscular re-education;Balance training;Therapeutic exercise;Therapeutic activities;Functional mobility training;Stair training;Gait training;Manual techniques    PT Goals (Current goals can be found in the Care Plan section)  Acute Rehab PT Goals Patient Stated Goal: regain indepednence and return home PT Goal Formulation: With patient Time For Goal Achievement: 04/17/23 Potential to Achieve Goals: Good    Frequency Min 4X/week     Co-evaluation  AM-PAC PT "6 Clicks" Mobility  Outcome Measure Help needed turning from your back to your side while in a flat bed without using bedrails?: A Little Help needed moving from lying on your back to sitting on the side of a flat bed without using bedrails?: A Little Help needed moving to and from a bed to a chair  (including a wheelchair)?: A Little Help needed standing up from a chair using your arms (e.g., wheelchair or bedside chair)?: A Little Help needed to walk in hospital room?: A Little Help needed climbing 3-5 steps with a railing? : A Lot 6 Click Score: 17    End of Session Equipment Utilized During Treatment: Gait belt Activity Tolerance: Patient tolerated treatment well Patient left: in bed;with call bell/phone within reach;with family/visitor present Nurse Communication: Mobility status PT Visit Diagnosis: Unsteadiness on feet (R26.81);Muscle weakness (generalized) (M62.81);Difficulty in walking, not elsewhere classified (R26.2);Other symptoms and signs involving the nervous system (R29.898);Other abnormalities of gait and mobility (R26.89)    Time: 5784-6962 PT Time Calculation (min) (ACUTE ONLY): 29 min   Charges:   PT Evaluation $PT Eval Moderate Complexity: 1 Mod PT Treatments $Gait Training: 8-22 mins        Wynn Maudlin, DPT Acute Rehabilitation Services Office [phone]  04/03/23 11:58 AM

## 2023-04-03 NOTE — Consult Note (Signed)
Reason for Consult:Facial Trauma Referring Physician: Dr. Alvino Blood  Cheryl Harrington is an 77 y.o. female.  HPI: The patient is a 77 yrs old female here for evaluation after a fall.  The note indicates that the patient tripped and landed on her head.  Seizure activity was suspected.  Patient was awake and alert in the ED.  She was amnestic to the time around the event.  Complains of mild headache and face pain.  CT scan shows fractures of the right orbit, maxillary sinus and old left orbital and maxillary sinus fractures.  Right periorbital laceration noted.  Intraparenchymal hemorrhage in right frontal lobe noted.   Past Medical History:  Diagnosis Date   Basal cell carcinoma of skin 12/14/2022   Had basal cell carcinoma excised from right labia in12/2014 and then further treated with additional excision and cryo 10/2012 per Dr. Tenny Craw note. Path report indicated positive peripheral and deep edges. Colpo for completeness 12/2015 - no bx needed. Per pt and confirmed with exam - area of prior biopsy R anterior labia. No residual skin changes noted.   Closed fracture of left orbital floor with routine healing 12/09/2021   Closed fracture of maxillary sinus (HCC) 12/09/2021   Elevated cholesterol    Fall 12/02/2021   GERD (gastroesophageal reflux disease)    Heel cord tightness, right 01/14/2020   Impaired mobility and ADLs 12/14/2022   Menopause present 12/14/2022   Osteopenia 12/14/2022   dx 2004- Actonel/Fosamax x 84yrs, followed by PCP, last dexa 2017 - per pt PCP has restarted her on bisphosphnate med   Osteoporosis    Plantar fasciitis 01/14/2020   Urinary incontinence 12/14/2022   occasional stress UI- discussed causes and reviewed mgmt options with pt 11/2015- declines intervention at this time, still declining PT etc at 2018 visit.    Past Surgical History:  Procedure Laterality Date   CATARACT EXTRACTION Bilateral 2007   CHOLECYSTECTOMY  2000   INGUINAL HERNIA REPAIR  1997     Family History  Problem Relation Age of Onset   Multiple myeloma Mother    Congestive Heart Failure Mother    Heart attack Mother    Colon cancer Father    Heart attack Brother 41   Stroke Maternal Grandmother    Stroke Paternal Grandmother     Social History:  reports that she has never smoked. She has never used smokeless tobacco. She reports that she does not drink alcohol and does not use drugs.  Allergies: No Known Allergies  Medications: I have reviewed the patient's current medications.  Results for orders placed or performed during the hospital encounter of 04/02/23 (from the past 48 hour(s))  Comprehensive metabolic panel     Status: Abnormal   Collection Time: 04/02/23  5:39 PM  Result Value Ref Range   Sodium 139 135 - 145 mmol/L   Potassium 3.8 3.5 - 5.1 mmol/L   Chloride 107 98 - 111 mmol/L   CO2 23 22 - 32 mmol/L   Glucose, Bld 133 (H) 70 - 99 mg/dL    Comment: Glucose reference range applies only to samples taken after fasting for at least 8 hours.   BUN 16 8 - 23 mg/dL   Creatinine, Ser 1.61 0.44 - 1.00 mg/dL   Calcium 8.2 (L) 8.9 - 10.3 mg/dL   Total Protein 5.4 (L) 6.5 - 8.1 g/dL   Albumin 3.2 (L) 3.5 - 5.0 g/dL   AST 18 15 - 41 U/L   ALT 9 0 - 44  U/L   Alkaline Phosphatase 59 38 - 126 U/L   Total Bilirubin 0.5 0.3 - 1.2 mg/dL   GFR, Estimated >16 >10 mL/min    Comment: (NOTE) Calculated using the CKD-EPI Creatinine Equation (2021)    Anion gap 9 5 - 15    Comment: Performed at [facility], [address] [place], [place] [address]  CBC     Status: Abnormal   Collection Time: 04/02/23  5:39 PM  Result Value Ref Range   WBC 6.6 4.0 - 10.5 K/uL   RBC 3.42 (L) 3.87 - 5.11 MIL/uL   Hemoglobin 11.0 (L) 12.0 - 15.0 g/dL   HCT 40.9 (L) 81.1 - 91.4 %   MCV 99.1 80.0 - 100.0 fL   MCH 32.2 26.0 - 34.0 pg   MCHC 32.4 30.0 - 36.0 g/dL   RDW 78.2 95.6 - 21.3 %   Platelets 165 150 - 400 K/uL   nRBC 0.0 0.0 - 0.2 %    Comment: Performed at [facility], [address], [place], [place] [address]  Protime-INR     Status: None   Collection Time: 04/02/23  5:39 PM  Result Value Ref Range   Prothrombin Time 14.3 11.4 - 15.2 seconds   INR 1.1 0.8 - 1.2    Comment: (NOTE) INR goal varies based on device and disease states. Performed at [facility], [address], [place], [place] [address]   Sample to Blood Bank     Status: None   Collection Time: 04/02/23  5:39 PM  Result Value Ref Range   Blood Bank Specimen SAMPLE AVAILABLE FOR TESTING    Sample Expiration      04/05/2023,2359 Performed at [facility], [address] [place], [place] [address]   I-Stat Chem 8, ED     Status: Abnormal   Collection Time: 04/02/23  5:43 PM  Result Value Ref Range   Sodium 142 135 - 145 mmol/L   Potassium 3.7 3.5 - 5.1 mmol/L   Chloride 105 98 - 111 mmol/L   BUN 17 8 - 23 mg/dL   Creatinine, Ser 4.13 0.44 - 1.00 mg/dL   Glucose, Bld 244 (H) 70 - 99 mg/dL    Comment: Glucose reference range applies only to samples taken after fasting for at least 8 hours.   Calcium, Ion 1.10 (L) 1.15 - 1.40 mmol/L   TCO2 26 22 - 32 mmol/L   Hemoglobin 10.5 (L) 12.0 - 15.0 g/dL   HCT 01.0 (L) 27.2 - 53.6 %  CBC     Status: Abnormal   Collection Time: 04/03/23  3:31 AM  Result Value Ref Range   WBC 9.0 4.0 - 10.5 K/uL   RBC 3.74 (L) 3.87 - 5.11 MIL/uL   Hemoglobin 11.8 (L) 12.0 - 15.0 g/dL   HCT 64.4 03.4 - 74.2 %   MCV 97.6 80.0 - 100.0 fL   MCH 31.6 26.0 - 34.0 pg   MCHC 32.3 30.0 - 36.0 g/dL   RDW 59.5 63.8 - 75.6 %   Platelets 185 150 - 400 K/uL   nRBC 0.0 0.0 - 0.2 %    Comment: Performed at [facility], [address] [place], [place] [address]  Basic metabolic panel     Status: Abnormal   Collection Time: 04/03/23  3:31 AM  Result Value Ref Range   Sodium 139 135 - 145 mmol/L   Potassium 3.6 3.5 - 5.1 mmol/L  Chloride 105 98 - 111 mmol/L   CO2 26 22 - 32 mmol/L   Glucose, Bld 138 (H) 70 - 99 mg/dL    Comment:  Glucose reference range applies only to samples taken after fasting for at least 8 hours.   BUN 12 8 - 23 mg/dL   Creatinine, Ser 2.95 0.44 - 1.00 mg/dL   Calcium 8.5 (L) 8.9 - 10.3 mg/dL   GFR, Estimated >62 >13 mL/min    Comment: (NOTE) Calculated using the CKD-EPI Creatinine Equation (2021)    Anion gap 8 5 - 15    Comment: Performed at [facility], [address], [place], [place] [address]    CT HEAD WO CONTRAST ( )  Result Date: 04/03/2023 CLINICAL DATA:  77 year old female status post fall with intracranial hemorrhage. Right orbit and maxillary sinus fractures. EXAM: CT HEAD WITHOUT CONTRAST TECHNIQUE: Contiguous axial images were obtained from the base of the skull through the vertex without intravenous contrast. RADIATION DOSE REDUCTION: This exam was performed according to the departmental dose-optimization program which includes automated exposure control, adjustment of the mA and/or kV according to patient size and/or use of iterative reconstruction technique. COMPARISON:  Head CT 04/02/2023. FINDINGS: Brain: 2 anterior right frontal lobe white matter shear hemorrhages are stable, the larger is 11 mm on series 3, image 24. However, additional superior frontal gyrus shear hemorrhages near the vertex are larger on series 3, image 28, now up to 13 mm individually. Mild white matter edema in these areas with no significant mass effect. And there is probably a small right superior frontal lobe hemorrhagic cerebral contusion on series 6, image 24, stable. Adjacent subarachnoid blood. Small superimposed posterior right temporal lobe shear hemorrhage or hemorrhagic contusion is stable and subcentimeter on series 6, image 11. No other superficial cerebral hemorrhagic contusion on either side. A small volume of bilateral subarachnoid hemorrhage has regressed but not resolved. Blood is still visible at both posterior sylvian fissures, parietal sulci. No IVH or ventriculomegaly. No midline  shift. Basilar cisterns are patent. Trace mostly isodense left vertex subdural hematoma is stable on series 5, image 29. No convincing right side subdural. Vascular: No suspicious intracranial vascular hyperdensity. Calcified atherosclerosis at the skull base. Skull: Face and orbit fractures as demonstrated by face CT yesterday. No new osseous injury identified. Sinuses/Orbits: Layering hemorrhage in the right maxillary sinus. Other Visualized paranasal sinuses and mastoids are stable and better aerated. Other: Broad-based right scalp and periorbital soft tissue injury. Right globe appears intact. IMPRESSION: 1. Multiple right frontal lobe shear hemorrhages, some mildly larger since yesterday (individually up to 1.3 cm). Small adjacent superficial hemorrhagic contusion is stable. And subcentimeter right temporal lobe shear hemorrhage or hemorrhagic contusion is stable. Mild regional edema and no significant mass effect. 2. Trace left subdural hematoma is stable. Decreased but not resolved small bilateral subarachnoid hemorrhage. 3. Facial fractures detailed yesterday. No calvarium fracture identified. No IVH, ventriculomegaly, or midline shift. Electronically Signed   By: Odessa Fleming M.D.   On: 04/03/2023 05:29   CT HEAD WO CONTRAST  Result Date: 04/02/2023 CLINICAL DATA:  Head trauma, moderate-severe.  Fall. EXAM: CT HEAD WITHOUT CONTRAST TECHNIQUE: Contiguous axial images were obtained from the base of the skull through the vertex without intravenous contrast. RADIATION DOSE REDUCTION: This exam was performed according to the departmental dose-optimization program which includes automated exposure control, adjustment of the mA and/or kV according to patient size and/or use of iterative reconstruction technique. COMPARISON:  Head CT 12/01/2021 FINDINGS: Brain:  There is scattered acute small to moderate volume subarachnoid hemorrhage supratentorially, right greater than left and with the greatest amount of  hemorrhage being in the posterior right sylvian fissure. Several small acute parenchymal hemorrhages are present in the subcortical white matter of the right frontal lobe and measure up to 1.1 cm. There is also a 3 mm subcortical parenchymal hemorrhage in the lateral right temporal lobe. A small subdural hematoma over the superior left cerebral convexity measures 3 mm in thickness. There is also minimal extra-axial hemorrhage along the falx. No midline shift or acute infarct is evident. Mild cerebral atrophy is within normal limits for age. Vascular: Calcified atherosclerosis at the skull base. Skull: No acute calvarial fracture. See separately reported maxillofacial CT. Sinuses/Orbits: See separately reported maxillofacial CT. Other: Right frontal scalp and periorbital hematomas. Critical Value/emergent results were called by telephone at the time of interpretation on 04/02/2023 at 6:58 pm to Dr. Alvino Blood, who verbally acknowledged these results. IMPRESSION: Acute traumatic intracranial hemorrhage including small to moderate volume subarachnoid hemorrhage, small subcortical parenchymal hemorrhages in the right frontal and right occipital lobes, and a small subdural hematoma over the left cerebral convexity. No intracranial mass effect. Electronically Signed   By: Sebastian Ache M.D.   On: 04/02/2023 18:58   CT Maxillofacial Wo Contrast  Result Date: 04/02/2023 CLINICAL DATA:  Facial trauma EXAM: CT MAXILLOFACIAL WITHOUT CONTRAST TECHNIQUE: Multidetector CT imaging of the maxillofacial structures was performed. Multiplanar CT image reconstructions were also generated. RADIATION DOSE REDUCTION: This exam was performed according to the departmental dose-optimization program which includes automated exposure control, adjustment of the mA and/or kV according to patient size and/or use of iterative reconstruction technique. COMPARISON:  CT 12/01/2021 FINDINGS: Osseous: Trace fluid in the inferior right mastoid  air cells. Mandibular heads are normally position. No mandibular fracture. Pterygoid plates and zygomatic arches appear intact. No acute nasal bone fracture. Orbits: Acute nondisplaced fracture floor of right orbit. Mild chronic deformity lateral wall right orbit. Small focus of intraorbital gas in the inferior right orbit. Sinuses: Moderate right hemosinus. Mild opacification of ethmoid air cells. Acute nondisplaced fractures involving the anterior and posterolateral walls of right maxillary sinus. Old left maxillary sinus and orbital floor fractures. Soft tissues: Moderate right periorbital hematoma with laceration. Limited intracranial: Small foci of intraparenchymal hemorrhage on the right. IMPRESSION: 1. Acute nondisplaced fractures involving the floor of right orbit, anterior and posterolateral walls of right maxillary sinus. Moderate right hemosinus. Small focus of intraorbital gas in the inferior right orbit. 2. Old left orbital and maxillary sinus fractures 3. Moderate right periorbital hematoma with laceration. 4. Acute foci of intraparenchymal hemorrhage within the right frontal lobe, see separately reported head CT. Electronically Signed   By: Jasmine Pang M.D.   On: 04/02/2023 18:50   CT CERVICAL SPINE WO CONTRAST  Result Date: 04/02/2023 CLINICAL DATA:  Mechanical fall EXAM: CT CERVICAL SPINE WITHOUT CONTRAST TECHNIQUE: Multidetector CT imaging of the cervical spine was performed without intravenous contrast. Multiplanar CT image reconstructions were also generated. RADIATION DOSE REDUCTION: This exam was performed according to the departmental dose-optimization program which includes automated exposure control, adjustment of the mA and/or kV according to patient size and/or use of iterative reconstruction technique. COMPARISON:  None Available. FINDINGS: Alignment: No subluxation.  Facet alignment within normal limits. Skull base and vertebrae: No acute fracture. No primary bone lesion or focal  pathologic process. Soft tissues and spinal canal: No prevertebral fluid or swelling. No visible canal hematoma. Disc levels: Multilevel degenerative change.  Moderate severe disc space narrowing C5-C6 with bilateral foraminal narrowing Upper chest: Negative. Other: None IMPRESSION: No CT evidence for acute osseous abnormality. Electronically Signed   By: Jasmine Pang M.D.   On: 04/02/2023 18:41   DG Chest Port 1 View  Result Date: 04/02/2023 CLINICAL DATA:  Trauma EXAM: PORTABLE CHEST 1 VIEW COMPARISON:  Chest x-ray December 01, 2021 FINDINGS: The cardiomediastinal silhouette is unchanged in contour. No focal pulmonary opacity. No pleural effusion or pneumothorax. The visualized upper abdomen is unremarkable. No acute osseous abnormality. IMPRESSION: No acute cardiopulmonary abnormality. Electronically Signed   By: Jacob Moores M.D.   On: 04/02/2023 18:11    Review of Systems Blood pressure 103/61, pulse (!) 58, temperature 98.9 F (37.2 C), temperature source Oral, resp. rate 12, height 5\' 4"  (1.626 m), weight 68 kg, SpO2 99 %. Physical Exam  Assessment/Plan: Right facial orbital and maxillary sinus fractures. No nose blowing.  Head of bed elevated as able.  No surgery planned.  Cheryl Harrington 04/03/2023, 8:36 AM

## 2023-04-03 NOTE — Discharge Summary (Signed)
[facility] Discharge Summary   Patient ID: Cheryl Harrington MRN: [id number] DOB/AGE: [birth year] 78 y.o.  Admit date: 04/02/2023 Discharge date: 04/03/2023  Admitting Diagnosis: Fall Subarachnoid hemorrhage TBI Orbital fracture Forehead lac  Discharge Diagnosis Patient Active Problem List   Diagnosis Date Noted   Fall from standing 04/02/2023   Osteoporosis 02/01/2023   GERD (gastroesophageal reflux disease) 02/01/2023   Elevated cholesterol 02/01/2023   Basal cell carcinoma of skin 12/14/2022   Impaired mobility and ADLs 12/14/2022   Menopause present 12/14/2022   Osteopenia 12/14/2022   Urinary incontinence 12/14/2022   Closed fracture of left orbital floor with routine healing 12/09/2021   Closed fracture of maxillary sinus (HCC) 12/09/2021   Fall 12/02/2021   Heel cord tightness, right 01/14/2020   Plantar fasciitis 01/14/2020    Consultants Facial trauma- Dr. Ulice Bold  Neurosurgery - Dr. Jake Samples   Imaging: CT HEAD WO CONTRAST ( )  Result Date: 04/03/2023 CLINICAL DATA:  77 year old female status post fall with intracranial hemorrhage. Right orbit and maxillary sinus fractures. EXAM: CT HEAD WITHOUT CONTRAST TECHNIQUE: Contiguous axial images were obtained from the base of the skull through the vertex without intravenous contrast. RADIATION DOSE REDUCTION: This exam was performed according to the departmental dose-optimization program which includes automated exposure control, adjustment of the mA and/or kV according to patient size and/or use of iterative reconstruction technique. COMPARISON:  Head CT 04/02/2023. FINDINGS: Brain: 2 anterior right frontal lobe white matter shear hemorrhages are stable, the larger is 11 mm on series 3, image 24. However, additional superior frontal gyrus shear hemorrhages near the vertex are larger on series 3, image 28, now up to 13 mm individually. Mild white matter edema in these areas with no significant mass effect. And  there is probably a small right superior frontal lobe hemorrhagic cerebral contusion on series 6, image 24, stable. Adjacent subarachnoid blood. Small superimposed posterior right temporal lobe shear hemorrhage or hemorrhagic contusion is stable and subcentimeter on series 6, image 11. No other superficial cerebral hemorrhagic contusion on either side. A small volume of bilateral subarachnoid hemorrhage has regressed but not resolved. Blood is still visible at both posterior sylvian fissures, parietal sulci. No IVH or ventriculomegaly. No midline shift. Basilar cisterns are patent. Trace mostly isodense left vertex subdural hematoma is stable on series 5, image 29. No convincing right side subdural. Vascular: No suspicious intracranial vascular hyperdensity. Calcified atherosclerosis at the skull base. Skull: Face and orbit fractures as demonstrated by face CT yesterday. No new osseous injury identified. Sinuses/Orbits: Layering hemorrhage in the right maxillary sinus. Other Visualized paranasal sinuses and mastoids are stable and better aerated. Other: Broad-based right scalp and periorbital soft tissue injury. Right globe appears intact. IMPRESSION: 1. Multiple right frontal lobe shear hemorrhages, some mildly larger since yesterday (individually up to 1.3 cm). Small adjacent superficial hemorrhagic contusion is stable. And subcentimeter right temporal lobe shear hemorrhage or hemorrhagic contusion is stable. Mild regional edema and no significant mass effect. 2. Trace left subdural hematoma is stable. Decreased but not resolved small bilateral subarachnoid hemorrhage. 3. Facial fractures detailed yesterday. No calvarium fracture identified. No IVH, ventriculomegaly, or midline shift. Electronically Signed   By: Odessa Fleming M.D.   On: 04/03/2023 05:29   CT HEAD WO CONTRAST  Result Date: 04/02/2023 CLINICAL DATA:  Head trauma, moderate-severe.  Fall. EXAM: CT HEAD WITHOUT CONTRAST TECHNIQUE: Contiguous axial images  were obtained from the base of the skull through the vertex without intravenous contrast. RADIATION DOSE REDUCTION: This exam was  performed according to the departmental dose-optimization program which includes automated exposure control, adjustment of the mA and/or kV according to patient size and/or use of iterative reconstruction technique. COMPARISON:  Head CT 12/01/2021 FINDINGS: Brain: There is scattered acute small to moderate volume subarachnoid hemorrhage supratentorially, right greater than left and with the greatest amount of hemorrhage being in the posterior right sylvian fissure. Several small acute parenchymal hemorrhages are present in the subcortical white matter of the right frontal lobe and measure up to 1.1 cm. There is also a 3 mm subcortical parenchymal hemorrhage in the lateral right temporal lobe. A small subdural hematoma over the superior left cerebral convexity measures 3 mm in thickness. There is also minimal extra-axial hemorrhage along the falx. No midline shift or acute infarct is evident. Mild cerebral atrophy is within normal limits for age. Vascular: Calcified atherosclerosis at the skull base. Skull: No acute calvarial fracture. See separately reported maxillofacial CT. Sinuses/Orbits: See separately reported maxillofacial CT. Other: Right frontal scalp and periorbital hematomas. Critical Value/emergent results were called by telephone at the time of interpretation on 04/02/2023 at 6:58 pm to Dr. Alvino Blood, who verbally acknowledged these results. IMPRESSION: Acute traumatic intracranial hemorrhage including small to moderate volume subarachnoid hemorrhage, small subcortical parenchymal hemorrhages in the right frontal and right occipital lobes, and a small subdural hematoma over the left cerebral convexity. No intracranial mass effect. Electronically Signed   By: Sebastian Ache M.D.   On: 04/02/2023 18:58   CT Maxillofacial Wo Contrast  Result Date: 04/02/2023 CLINICAL  DATA:  Facial trauma EXAM: CT MAXILLOFACIAL WITHOUT CONTRAST TECHNIQUE: Multidetector CT imaging of the maxillofacial structures was performed. Multiplanar CT image reconstructions were also generated. RADIATION DOSE REDUCTION: This exam was performed according to the departmental dose-optimization program which includes automated exposure control, adjustment of the mA and/or kV according to patient size and/or use of iterative reconstruction technique. COMPARISON:  CT 12/01/2021 FINDINGS: Osseous: Trace fluid in the inferior right mastoid air cells. Mandibular heads are normally position. No mandibular fracture. Pterygoid plates and zygomatic arches appear intact. No acute nasal bone fracture. Orbits: Acute nondisplaced fracture floor of right orbit. Mild chronic deformity lateral wall right orbit. Small focus of intraorbital gas in the inferior right orbit. Sinuses: Moderate right hemosinus. Mild opacification of ethmoid air cells. Acute nondisplaced fractures involving the anterior and posterolateral walls of right maxillary sinus. Old left maxillary sinus and orbital floor fractures. Soft tissues: Moderate right periorbital hematoma with laceration. Limited intracranial: Small foci of intraparenchymal hemorrhage on the right. IMPRESSION: 1. Acute nondisplaced fractures involving the floor of right orbit, anterior and posterolateral walls of right maxillary sinus. Moderate right hemosinus. Small focus of intraorbital gas in the inferior right orbit. 2. Old left orbital and maxillary sinus fractures 3. Moderate right periorbital hematoma with laceration. 4. Acute foci of intraparenchymal hemorrhage within the right frontal lobe, see separately reported head CT. Electronically Signed   By: Jasmine Pang M.D.   On: 04/02/2023 18:50   CT CERVICAL SPINE WO CONTRAST  Result Date: 04/02/2023 CLINICAL DATA:  Mechanical fall EXAM: CT CERVICAL SPINE WITHOUT CONTRAST TECHNIQUE: Multidetector CT imaging of the cervical  spine was performed without intravenous contrast. Multiplanar CT image reconstructions were also generated. RADIATION DOSE REDUCTION: This exam was performed according to the departmental dose-optimization program which includes automated exposure control, adjustment of the mA and/or kV according to patient size and/or use of iterative reconstruction technique. COMPARISON:  None Available. FINDINGS: Alignment: No subluxation.  Facet alignment within normal limits.  Skull base and vertebrae: No acute fracture. No primary bone lesion or focal pathologic process. Soft tissues and spinal canal: No prevertebral fluid or swelling. No visible canal hematoma. Disc levels: Multilevel degenerative change. Moderate severe disc space narrowing C5-C6 with bilateral foraminal narrowing Upper chest: Negative. Other: None IMPRESSION: No CT evidence for acute osseous abnormality. Electronically Signed   By: Jasmine Pang M.D.   On: 04/02/2023 18:41   DG Chest Port 1 View  Result Date: 04/02/2023 CLINICAL DATA:  Trauma EXAM: PORTABLE CHEST 1 VIEW COMPARISON:  Chest x-ray December 01, 2021 FINDINGS: The cardiomediastinal silhouette is unchanged in contour. No focal pulmonary opacity. No pleural effusion or pneumothorax. The visualized upper abdomen is unremarkable. No acute osseous abnormality. IMPRESSION: No acute cardiopulmonary abnormality. Electronically Signed   By: Jacob Moores M.D.   On: 04/02/2023 18:11    Procedures none  HPI:  TAKIA MONTEL is an 77 y.o. female hx HTN, HLD, GERD presenting emergency department after ground-level fall from standing when walking out of a department store.  Reports that she tripped on the sidewalk and struck her forehead/face on the concrete.  She was initially difficult to arouse and drowsy.  By the time EMS arrived, she was awake, alert, and talking.  She was transported here for further evaluation.  She underwent workup in the emergency department.  After undergoing the workup,  we are subsequently asked to see her for admission   Reports that they were walking into [employer] to buy a new phone charging cord when she tripped on the sidewalk.  Her husband was directly behind her and witnessed the fall.  She fell forward and struck her head on the concrete.  Only complaint is facial pain around left face.  Denies pain in her neck, chest, abdomen/pelvis, back, upper extremities, or lower extremities.   Hospital Course:  Trauma workup was significant for the below injuries along with their management:  GLF TBI - SAH/SDH/small intraparenchymal hemorrhages - follow up [facility] 7/1 with some evolution of injury but no significant mass effect/progression Dr. Jake Samples following, recommended Keppra x 7 days, holding ASA 14 days, and outpatient follow up as needed. Non-op. Right orbital floor and maxillary sinus fractures - Dr. Ulice Bold consulted, recommended non-operative management, soft diet, follow up as needed. Right brow laceration - closed by EDP with absorbable suture    Diet was advanced as tolerated to soft. She worked with PT, OT, and cognitive therapies.  On 04/03/23 he patient was voiding well, tolerating diet, ambulating, pain controlled, vital signs stable, and felt stable for discharge home with the support of her son and husband.  Patient will follow upas below and knows to call with questions or concerns.  I did not personally evaluate this patient, therefore the above information was obtained entirely from chart review.    Allergies as of 04/03/2023   No Known Allergies      Medication List     STOP taking these medications    metoprolol tartrate 100 MG tablet Commonly known as: LOPRESSOR       TAKE these medications    acetaminophen 325 MG tablet Commonly known as: TYLENOL Take 325 mg by mouth every 6 (six) hours as needed for mild pain or moderate pain.   amoxicillin-clavulanate 875-125 MG tablet Commonly known as: AUGMENTIN Take 1 tablet by  mouth 2 (two) times daily for 4 days.   ascorbic acid 500 MG tablet Commonly known as: VITAMIN C Take 500 mg by mouth daily.  aspirin EC 81 MG tablet Take 1 tablet (81 mg total) by mouth daily. Swallow whole. Start taking on: April 16, 2023 What changed: These instructions start on April 16, 2023. If you are unsure what to do until then, ask your doctor or other care provider.   citalopram 10 MG tablet Commonly known as: CELEXA Take 10 mg by mouth daily.   Crestor 10 MG tablet Generic drug: rosuvastatin Take 10 mg by mouth daily.   finasteride 5 MG tablet Commonly known as: PROSCAR Take 2.5 mg by mouth daily.   levETIRAcetam 500 MG tablet Commonly known as: Keppra Take 1 tablet (500 mg total) by mouth 2 (two) times daily for 6 days.   Multi-Vitamin tablet Take 1 tablet by mouth daily.   omeprazole 40 MG capsule Commonly known as: PRILOSEC Take 40 mg by mouth daily.   ondansetron 4 MG disintegrating tablet Commonly known as: ZOFRAN-ODT Take 1 tablet (4 mg total) by mouth every 6 (six) hours as needed for nausea.   oxyCODONE 5 MG immediate release tablet Commonly known as: Oxy IR/ROXICODONE Take 0.5-1 tablets (2.5-5 mg total) by mouth every 6 (six) hours as needed for severe pain or moderate pain.   prednisoLONE acetate 1 % ophthalmic suspension Commonly known as: PRED FORTE Place 1 drop into both eyes 2 (two) times daily.   Prolia 60 MG/ML Sosy injection Generic drug: denosumab Inject 60 mg into the skin every 6 (six) months.   Super Calcium 1500 (600 Ca) MG Tabs tablet Generic drug: calcium carbonate Take 2 tablets by mouth daily.          Follow-up Information     Dawley, Troy C, DO. Call.   Why: As needed with concerns regarding traumatic brain injury Contact information: [address] [place] [address] [phone]         Peggye Form, DO. Call.   Specialty: Plastic Surgery Why: As needed regarding facial  fractures Contact information: [address] [place] [address] [phone]         Marylen Ponto, MD. Schedule an appointment as soon as possible for a visit.   Specialty: Family Medicine Why: For post hospital visit Contact information: [address] [place] [address] [phone]         [facility]. Call.   Why: As needed with questions regarding recent hospitalization. Contact information: [address] [place] [address] [phone]                Signed: Hosie Spangle, [facility] 04/03/2023, 3:36 PM

## 2023-04-03 NOTE — Telephone Encounter (Signed)
Pharmacy Patient Advocate Encounter   Received notification that prior authorization for Ondansetron 4MG  dispersible tablets is required/requested.  PA submitted to [employer]/ Rx Advance via CoverMyMeds Key/confirmation #/EOC [id number] Status is pending

## 2023-04-03 NOTE — Discharge Instructions (Signed)
No nose blowing for 2-3 weeks and elevate head as able with facial fractures.  Sutures to right brow are absorbable and do not have to be removed.

## 2023-04-03 NOTE — ED Notes (Signed)
ED TO INPATIENT HANDOFF REPORT  ED Nurse Name and Phone #: [phone]  S Name/Age/Gender Cheryl Harrington 77 y.o. female Room/Bed: 004C/004C  Code Status   Code Status: Full Code  Home/SNF/Other Home Patient oriented to: self, place, time, and situation Is this baseline? Yes   Triage Complete: Triage complete  Chief Complaint Fall from standing [W19.XXXA]  Triage Note Pt to ED via EMS fafter a mechanical fall from standing. Per EMS pt was walking out of [employer] store when she tripped on the sidewalk and hit her head on the concrete. Per EMS family stated pt laid on the ground for a while and was hard to arouse. EMS reported initial GCS of 15 on their arrival. Pt has small laceration to right side of head with controlled bleeding. Hematoma to right eye. Pt Aox4 in triage, c/o right eye pain and headache. Pt denies any other injuries, or n/v. Not on thinners. Trauma team and MD at bedside.     Allergies No Known Allergies  Level of Care/Admitting Diagnosis ED Disposition     ED Disposition  Admit   Condition  --   Comment  Hospital Area: [facility] [[id number]]  Level of Care: Progressive [102]  Admit to Progressive based on following criteria: NEUROLOGICAL AND NEUROSURGICAL complex patients with significant risk of instability, who do not meet ICU criteria, yet require close observation or frequent assessment (< / = every 2 - 4 hours) with medical / nursing intervention.  May admit patient to [facility] or [facility] if equivalent level of care is available:: No  Covid Evaluation: Asymptomatic - no recent exposure (last 10 days) testing not required  Diagnosis: Fall from standing [960454]  Admitting Physician: TRAUMA MD [[id number]]  Attending Physician: TRAUMA MD [[id number]]  Bed request comments: 4NP  Certification:: I certify this patient will need inpatient services for at least 2 midnights  Estimated Length of Stay: 2          B Medical/Surgery  History Past Medical History:  Diagnosis Date   Basal cell carcinoma of skin 12/14/2022   Had basal cell carcinoma excised from right labia in12/2014 and then further treated with additional excision and cryo 10/2012 per Dr. Tenny Craw note. Path report indicated positive peripheral and deep edges. Colpo for completeness 12/2015 - no bx needed. Per pt and confirmed with exam - area of prior biopsy R anterior labia. No residual skin changes noted.   Closed fracture of left orbital floor with routine healing 12/09/2021   Closed fracture of maxillary sinus (HCC) 12/09/2021   Elevated cholesterol    Fall 12/02/2021   GERD (gastroesophageal reflux disease)    Heel cord tightness, right 01/14/2020   Impaired mobility and ADLs 12/14/2022   Menopause present 12/14/2022   Osteopenia 12/14/2022   dx 2004- Actonel/Fosamax x 80yrs, followed by PCP, last dexa 2017 - per pt PCP has restarted her on bisphosphnate med   Osteoporosis    Plantar fasciitis 01/14/2020   Urinary incontinence 12/14/2022   occasional stress UI- discussed causes and reviewed mgmt options with pt 11/2015- declines intervention at this time, still declining PT etc at 2018 visit.   Past Surgical History:  Procedure Laterality Date   CATARACT EXTRACTION Bilateral 2007   CHOLECYSTECTOMY  2000   INGUINAL HERNIA REPAIR  1997     A IV Location/Drains/Wounds Patient Lines/Drains/Airways Status     Active Line/Drains/Airways     Name Placement date Placement time Site Days   Peripheral IV 02/10/23 20  G Right Antecubital 02/10/23  1241  Antecubital  52            Intake/Output Last 24 hours  Intake/Output Summary (Last 24 hours) at 04/03/2023 1123 Last data filed at 04/03/2023 0306 Gross per 24 hour  Intake 100 ml  Output --  Net 100 ml    Labs/Imaging Results for orders placed or performed during the hospital encounter of 04/02/23 (from the past 48 hour(s))  Comprehensive metabolic panel     Status: Abnormal   Collection  Time: 04/02/23  5:39 PM  Result Value Ref Range   Sodium 139 135 - 145 mmol/L   Potassium 3.8 3.5 - 5.1 mmol/L   Chloride 107 98 - 111 mmol/L   CO2 23 22 - 32 mmol/L   Glucose, Bld 133 (H) 70 - 99 mg/dL    Comment: Glucose reference range applies only to samples taken after fasting for at least 8 hours.   BUN 16 8 - 23 mg/dL   Creatinine, Ser 4.09 0.44 - 1.00 mg/dL   Calcium 8.2 (L) 8.9 - 10.3 mg/dL   Total Protein 5.4 (L) 6.5 - 8.1 g/dL   Albumin 3.2 (L) 3.5 - 5.0 g/dL   AST 18 15 - 41 U/L   ALT 9 0 - 44 U/L   Alkaline Phosphatase 59 38 - 126 U/L   Total Bilirubin 0.5 0.3 - 1.2 mg/dL   GFR, Estimated >81 >19 mL/min    Comment: (NOTE) Calculated using the CKD-EPI Creatinine Equation (2021)    Anion gap 9 5 - 15    Comment: Performed at [facility], [address] [place], [place] [address]  CBC     Status: Abnormal   Collection Time: 04/02/23  5:39 PM  Result Value Ref Range   WBC 6.6 4.0 - 10.5 K/uL   RBC 3.42 (L) 3.87 - 5.11 MIL/uL   Hemoglobin 11.0 (L) 12.0 - 15.0 g/dL   HCT 95.6 (L) 21.3 - 08.6 %   MCV 99.1 80.0 - 100.0 fL   MCH 32.2 26.0 - 34.0 pg   MCHC 32.4 30.0 - 36.0 g/dL   RDW 57.8 46.9 - 62.9 %   Platelets 165 150 - 400 K/uL   nRBC 0.0 0.0 - 0.2 %    Comment: Performed at [facility], [address] [place], [place] [address]  Protime-INR     Status: None   Collection Time: 04/02/23  5:39 PM  Result Value Ref Range   Prothrombin Time 14.3 11.4 - 15.2 seconds   INR 1.1 0.8 - 1.2    Comment: (NOTE) INR goal varies based on device and disease states. Performed at [facility], [address] [place], [place] [address]   Sample to Blood Bank     Status: None   Collection Time: 04/02/23  5:39 PM  Result Value Ref Range   Blood Bank Specimen SAMPLE AVAILABLE FOR TESTING    Sample Expiration      04/05/2023,2359 Performed at [facility], [address] [place], [place] [address]   I-Stat Chem 8, ED     Status: Abnormal    Collection Time: 04/02/23  5:43 PM  Result Value Ref Range   Sodium 142 135 - 145 mmol/L   Potassium 3.7 3.5 - 5.1 mmol/L   Chloride 105 98 - 111 mmol/L   BUN 17 8 - 23 mg/dL   Creatinine, Ser 5.36 0.44 - 1.00 mg/dL   Glucose, Bld 644 (  H) 70 - 99 mg/dL    Comment: Glucose reference range applies only to samples taken after fasting for at least 8 hours.   Calcium, Ion 1.10 (L) 1.15 - 1.40 mmol/L   TCO2 26 22 - 32 mmol/L   Hemoglobin 10.5 (L) 12.0 - 15.0 g/dL   HCT 19.1 (L) 47.8 - 29.5 %  CBC     Status: Abnormal   Collection Time: 04/03/23  3:31 AM  Result Value Ref Range   WBC 9.0 4.0 - 10.5 K/uL   RBC 3.74 (L) 3.87 - 5.11 MIL/uL   Hemoglobin 11.8 (L) 12.0 - 15.0 g/dL   HCT 62.1 30.8 - 65.7 %   MCV 97.6 80.0 - 100.0 fL   MCH 31.6 26.0 - 34.0 pg   MCHC 32.3 30.0 - 36.0 g/dL   RDW 84.6 96.2 - 95.2 %   Platelets 185 150 - 400 K/uL   nRBC 0.0 0.0 - 0.2 %    Comment: Performed at [facility], [address] [place], [place] [address]  Basic metabolic panel     Status: Abnormal   Collection Time: 04/03/23  3:31 AM  Result Value Ref Range   Sodium 139 135 - 145 mmol/L   Potassium 3.6 3.5 - 5.1 mmol/L   Chloride 105 98 - 111 mmol/L   CO2 26 22 - 32 mmol/L   Glucose, Bld 138 (H) 70 - 99 mg/dL    Comment: Glucose reference range applies only to samples taken after fasting for at least 8 hours.   BUN 12 8 - 23 mg/dL   Creatinine, Ser 4.40 0.44 - 1.00 mg/dL   Calcium 8.5 (L) 8.9 - 10.3 mg/dL   GFR, Estimated >10 >27 mL/min    Comment: (NOTE) Calculated using the CKD-EPI Creatinine Equation (2021)    Anion gap 8 5 - 15    Comment: Performed at [facility], [address] [place], [place] [address]   CT HEAD WO CONTRAST ( )  Result Date: 04/03/2023 CLINICAL DATA:  77 year old female status post fall with intracranial hemorrhage. Right orbit and maxillary sinus fractures. EXAM: CT HEAD WITHOUT CONTRAST TECHNIQUE: Contiguous axial images were obtained from the base of  the skull through the vertex without intravenous contrast. RADIATION DOSE REDUCTION: This exam was performed according to the departmental dose-optimization program which includes automated exposure control, adjustment of the mA and/or kV according to patient size and/or use of iterative reconstruction technique. COMPARISON:  Head CT 04/02/2023. FINDINGS: Brain: 2 anterior right frontal lobe white matter shear hemorrhages are stable, the larger is 11 mm on series 3, image 24. However, additional superior frontal gyrus shear hemorrhages near the vertex are larger on series 3, image 28, now up to 13 mm individually. Mild white matter edema in these areas with no significant mass effect. And there is probably a small right superior frontal lobe hemorrhagic cerebral contusion on series 6, image 24, stable. Adjacent subarachnoid blood. Small superimposed posterior right temporal lobe shear hemorrhage or hemorrhagic contusion is stable and subcentimeter on series 6, image 11. No other superficial cerebral hemorrhagic contusion on either side. A small volume of bilateral subarachnoid hemorrhage has regressed but not resolved. Blood is still visible at both posterior sylvian fissures, parietal sulci. No IVH or ventriculomegaly. No midline shift. Basilar cisterns are patent. Trace mostly isodense left vertex subdural hematoma is stable on series 5, image 29. No convincing right side subdural. Vascular: No suspicious intracranial vascular hyperdensity. Calcified atherosclerosis at the skull base.  Skull: Face and orbit fractures as demonstrated by face CT yesterday. No new osseous injury identified. Sinuses/Orbits: Layering hemorrhage in the right maxillary sinus. Other Visualized paranasal sinuses and mastoids are stable and better aerated. Other: Broad-based right scalp and periorbital soft tissue injury. Right globe appears intact. IMPRESSION: 1. Multiple right frontal lobe shear hemorrhages, some mildly larger since  yesterday (individually up to 1.3 cm). Small adjacent superficial hemorrhagic contusion is stable. And subcentimeter right temporal lobe shear hemorrhage or hemorrhagic contusion is stable. Mild regional edema and no significant mass effect. 2. Trace left subdural hematoma is stable. Decreased but not resolved small bilateral subarachnoid hemorrhage. 3. Facial fractures detailed yesterday. No calvarium fracture identified. No IVH, ventriculomegaly, or midline shift. Electronically Signed   By: Odessa Fleming M.D.   On: 04/03/2023 05:29   CT HEAD WO CONTRAST  Result Date: 04/02/2023 CLINICAL DATA:  Head trauma, moderate-severe.  Fall. EXAM: CT HEAD WITHOUT CONTRAST TECHNIQUE: Contiguous axial images were obtained from the base of the skull through the vertex without intravenous contrast. RADIATION DOSE REDUCTION: This exam was performed according to the departmental dose-optimization program which includes automated exposure control, adjustment of the mA and/or kV according to patient size and/or use of iterative reconstruction technique. COMPARISON:  Head CT 12/01/2021 FINDINGS: Brain: There is scattered acute small to moderate volume subarachnoid hemorrhage supratentorially, right greater than left and with the greatest amount of hemorrhage being in the posterior right sylvian fissure. Several small acute parenchymal hemorrhages are present in the subcortical white matter of the right frontal lobe and measure up to 1.1 cm. There is also a 3 mm subcortical parenchymal hemorrhage in the lateral right temporal lobe. A small subdural hematoma over the superior left cerebral convexity measures 3 mm in thickness. There is also minimal extra-axial hemorrhage along the falx. No midline shift or acute infarct is evident. Mild cerebral atrophy is within normal limits for age. Vascular: Calcified atherosclerosis at the skull base. Skull: No acute calvarial fracture. See separately reported maxillofacial CT. Sinuses/Orbits: See  separately reported maxillofacial CT. Other: Right frontal scalp and periorbital hematomas. Critical Value/emergent results were called by telephone at the time of interpretation on 04/02/2023 at 6:58 pm to Dr. Alvino Blood, who verbally acknowledged these results. IMPRESSION: Acute traumatic intracranial hemorrhage including small to moderate volume subarachnoid hemorrhage, small subcortical parenchymal hemorrhages in the right frontal and right occipital lobes, and a small subdural hematoma over the left cerebral convexity. No intracranial mass effect. Electronically Signed   By: Sebastian Ache M.D.   On: 04/02/2023 18:58   CT Maxillofacial Wo Contrast  Result Date: 04/02/2023 CLINICAL DATA:  Facial trauma EXAM: CT MAXILLOFACIAL WITHOUT CONTRAST TECHNIQUE: Multidetector CT imaging of the maxillofacial structures was performed. Multiplanar CT image reconstructions were also generated. RADIATION DOSE REDUCTION: This exam was performed according to the departmental dose-optimization program which includes automated exposure control, adjustment of the mA and/or kV according to patient size and/or use of iterative reconstruction technique. COMPARISON:  CT 12/01/2021 FINDINGS: Osseous: Trace fluid in the inferior right mastoid air cells. Mandibular heads are normally position. No mandibular fracture. Pterygoid plates and zygomatic arches appear intact. No acute nasal bone fracture. Orbits: Acute nondisplaced fracture floor of right orbit. Mild chronic deformity lateral wall right orbit. Small focus of intraorbital gas in the inferior right orbit. Sinuses: Moderate right hemosinus. Mild opacification of ethmoid air cells. Acute nondisplaced fractures involving the anterior and posterolateral walls of right maxillary sinus. Old left maxillary sinus and orbital floor fractures. Soft  tissues: Moderate right periorbital hematoma with laceration. Limited intracranial: Small foci of intraparenchymal hemorrhage on the  right. IMPRESSION: 1. Acute nondisplaced fractures involving the floor of right orbit, anterior and posterolateral walls of right maxillary sinus. Moderate right hemosinus. Small focus of intraorbital gas in the inferior right orbit. 2. Old left orbital and maxillary sinus fractures 3. Moderate right periorbital hematoma with laceration. 4. Acute foci of intraparenchymal hemorrhage within the right frontal lobe, see separately reported head CT. Electronically Signed   By: Jasmine Pang M.D.   On: 04/02/2023 18:50   CT CERVICAL SPINE WO CONTRAST  Result Date: 04/02/2023 CLINICAL DATA:  Mechanical fall EXAM: CT CERVICAL SPINE WITHOUT CONTRAST TECHNIQUE: Multidetector CT imaging of the cervical spine was performed without intravenous contrast. Multiplanar CT image reconstructions were also generated. RADIATION DOSE REDUCTION: This exam was performed according to the departmental dose-optimization program which includes automated exposure control, adjustment of the mA and/or kV according to patient size and/or use of iterative reconstruction technique. COMPARISON:  None Available. FINDINGS: Alignment: No subluxation.  Facet alignment within normal limits. Skull base and vertebrae: No acute fracture. No primary bone lesion or focal pathologic process. Soft tissues and spinal canal: No prevertebral fluid or swelling. No visible canal hematoma. Disc levels: Multilevel degenerative change. Moderate severe disc space narrowing C5-C6 with bilateral foraminal narrowing Upper chest: Negative. Other: None IMPRESSION: No CT evidence for acute osseous abnormality. Electronically Signed   By: Jasmine Pang M.D.   On: 04/02/2023 18:41   DG Chest Port 1 View  Result Date: 04/02/2023 CLINICAL DATA:  Trauma EXAM: PORTABLE CHEST 1 VIEW COMPARISON:  Chest x-ray December 01, 2021 FINDINGS: The cardiomediastinal silhouette is unchanged in contour. No focal pulmonary opacity. No pleural effusion or pneumothorax. The visualized upper  abdomen is unremarkable. No acute osseous abnormality. IMPRESSION: No acute cardiopulmonary abnormality. Electronically Signed   By: Jacob Moores M.D.   On: 04/02/2023 18:11    Pending Labs Unresulted Labs (From admission, onward)     Start     Ordered   04/02/23 1736  Ethanol  (Trauma Panel)  Once,   URGENT        04/02/23 1736   04/02/23 1736  Lactic acid, plasma  (Trauma Panel)  Once,   STAT        04/02/23 1736            Vitals/Pain Today's Vitals   04/03/23 0630 04/03/23 0800 04/03/23 0845 04/03/23 0953  BP: 103/61 113/67 127/73 108/60  Pulse: (!) 58 (!) 58 65 (!) 57  Resp: 12 19 13 20   Temp:    98.3 F (36.8 C)  TempSrc:    Oral  SpO2: 99% 100% 97% 99%  Weight:      Height:      PainSc:        Isolation Precautions No active isolations  Medications Medications  rosuvastatin (CRESTOR) tablet 10 mg (has no administration in time range)  citalopram (CELEXA) tablet 10 mg (has no administration in time range)  pantoprazole (PROTONIX) EC tablet 40 mg (40 mg Oral Given 04/03/23 0931)  acetaminophen (TYLENOL) tablet 1,000 mg (1,000 mg Oral Not Given 04/03/23 0541)  docusate sodium (COLACE) capsule 100 mg (100 mg Oral Given 04/03/23 0931)  polyethylene glycol (MIRALAX / GLYCOLAX) packet 17 g (has no administration in time range)  ondansetron (ZOFRAN-ODT) disintegrating tablet 4 mg (has no administration in time range)    Or  ondansetron (ZOFRAN) injection 4 mg (has no administration in time range)  metoprolol tartrate (LOPRESSOR) injection 5 mg (has no administration in time range)  hydrALAZINE (APRESOLINE) injection 10 mg (has no administration in time range)  traMADol (ULTRAM) tablet 50 mg (has no administration in time range)  HYDROmorphone (DILAUDID) injection 0.5 mg (0.5 mg Intravenous Given 04/03/23 0520)  Ampicillin-Sulbactam (UNASYN) 3 g in sodium chloride 0.9 % 100 mL IVPB (0 g Intravenous Stopped 04/03/23 1017)  levETIRAcetam (KEPPRA) IVPB 500 mg/100 mL premix (0  mg Intravenous Stopped 04/03/23 1017)  lidocaine (PF) (XYLOCAINE) 1 % injection 5 mL (5 mLs Other Given 04/02/23 2124)  HYDROmorphone (DILAUDID) injection 0.5 mg (0.5 mg Intravenous Given 04/02/23 1832)  levETIRAcetam (KEPPRA) IVPB 1000 mg/100 mL premix (0 mg Intravenous Stopped 04/02/23 1859)  ondansetron (ZOFRAN) injection 4 mg (4 mg Intravenous Given 04/02/23 1830)  Ampicillin-Sulbactam (UNASYN) 3 g in sodium chloride 0.9 % 100 mL IVPB (0 g Intravenous Stopped 04/02/23 1918)    Mobility walks with device     Focused Assessments Neuro Assessment Handoff:  Swallow screen pass? Yes  Cardiac Rhythm: Normal sinus rhythm NIH Stroke Scale  Dizziness Present: No Headache Present: No Interval: Shift assessment Level of Consciousness (1a.)   : Alert, keenly responsive LOC Questions (1b. )   : Answers both questions correctly LOC Commands (1c. )   : Performs both tasks correctly Best Gaze (2. )  : Normal Visual (3. )  : No visual loss Facial Palsy (4. )    : Normal symmetrical movements Motor Arm, Left (5a. )   : No drift Motor Arm, Right (5b. ) : No drift Motor Leg, Left (6a. )  : No drift Motor Leg, Right (6b. ) : No drift Limb Ataxia (7. ): Absent Sensory (8. )  : Normal, no sensory loss Best Language (9. )  : No aphasia Dysarthria (10. ): Normal Extinction/Inattention (11.)   : No Abnormality Complete NIHSS TOTAL: 0     Neuro Assessment: Within Defined Limits Neuro Checks:   Shift assessment (04/03/23 4098)  Has TPA been given? No If patient is a Neuro Trauma and patient is going to OR before floor call report to 4N Charge nurse: [phone] or [phone]   R Recommendations: See Admitting Provider Note  Report given to:   Additional Notes:

## 2023-04-03 NOTE — Evaluation (Signed)
Occupational Therapy Evaluation Patient Details Name: Cheryl Harrington MRN: [id number] DOB: 17-Jan-1946 Today's Date: 04/03/2023   History of Present Illness Patient is 77 y.o. female who presented as a level 2 trauma due to tripping and falling onto her head yesterday. She is amnestic to the event. She denies any seizure-like activity, c/o a small headache, denies numbness/tingling or weakness, denies any vision changes, denies any bowel or bladder incontinence. Imaging in ED revealed SAH/SDH/small intraparenchymal hemorrhages, Rt orbital floor and maxillary sinus fractures. PMH significant for GERD, osteoporosis, falls.   Clinical Impression   Patient admitted for the diagnosis above.  PTA she lives at home with her spouse, and remains very active and independent.  Patient appears to be doing better this afternoon with mobility and complex thought compared to her PT assessment.  Patient needing supervision for in room and hall mobility without an AD, and is not needing any assist with ADL completion from sit to stand.  No needs identified in the acute setting, and no post acute OT recommended.  Concussion symptoms reviewed, and patient is not demonstrating any cognitive impacts.         Recommendations for follow up therapy are one component of a multi-disciplinary discharge planning process, led by the attending physician.  Recommendations may be updated based on patient status, additional functional criteria and insurance authorization.   Assistance Recommended at Discharge Intermittent Supervision/Assistance  Patient can return home with the following Assist for transportation    Functional Status Assessment  Patient has had a recent decline in their functional status and demonstrates the ability to make significant improvements in function in a reasonable and predictable amount of time.  Equipment Recommendations  None recommended by OT    Recommendations for Other Services        Precautions / Restrictions Precautions Precautions: Fall Restrictions Weight Bearing Restrictions: No      Mobility Bed Mobility   Bed Mobility: Supine to Sit, Sit to Supine     Supine to sit: Supervision Sit to supine: Supervision        Transfers Overall transfer level: Needs assistance Equipment used: None Transfers: Sit to/from Stand, Bed to chair/wheelchair/BSC Sit to Stand: Supervision     Step pivot transfers: Supervision            Balance Overall balance assessment: Needs assistance Sitting-balance support: Feet supported Sitting balance-Leahy Scale: Good     Standing balance support: No upper extremity supported Standing balance-Leahy Scale: Fair                             ADL either performed or assessed with clinical judgement   ADL Overall ADL's : At baseline                                       General ADL Comments: generalized supervision post fall     Vision   Vision Assessment?: No apparent visual deficits     Perception     Praxis      Pertinent Vitals/Pain Pain Assessment Pain Assessment: No/denies pain     Hand Dominance Right   Extremity/Trunk Assessment Upper Extremity Assessment Upper Extremity Assessment: Overall WFL for tasks assessed   Lower Extremity Assessment Lower Extremity Assessment: Defer to PT evaluation   Cervical / Trunk Assessment Cervical / Trunk Assessment: Normal   Communication Communication Communication: [facility]  Cognition Arousal/Alertness: Awake/alert Behavior During Therapy: WFL for tasks assessed/performed Overall Cognitive Status: Within Functional Limits for tasks assessed                                                        Home Living Family/patient expects to be discharged to:: Private residence Living Arrangements: Spouse/significant other Available Help at Discharge: Family Type of Home: House Home Access: Stairs to  enter Secretary/administrator of Steps: 1 Entrance Stairs-Rails: None Home Layout: One level     Bathroom Shower/Tub: Chief Strategy Officer: Handicapped height Bathroom Accessibility: Yes   Home Equipment: Agricultural consultant (2 wheels);Cane - single point;Shower seat          Prior Functioning/Environment Prior Level of Function : Independent/Modified Independent;Driving;Working/employed                        OT Problem List: Impaired balance (sitting and/or standing)      OT Treatment/Interventions:      OT Goals(Current goals can be found in the care plan section) Acute Rehab OT Goals Patient Stated Goal: Return home OT Goal Formulation: With patient Time For Goal Achievement: 04/07/23 Potential to Achieve Goals: Good  OT Frequency:      Co-evaluation              AM-PAC OT "6 Clicks" Daily Activity     Outcome Measure Help from another person eating meals?: None Help from another person taking care of personal grooming?: None Help from another person toileting, which includes using toliet, bedpan, or urinal?: A Little Help from another person bathing (including washing, rinsing, drying)?: A Little Help from another person to put on and taking off regular upper body clothing?: None Help from another person to put on and taking off regular lower body clothing?: A Little 6 Click Score: 21   End of Session Equipment Utilized During Treatment: Gait belt Nurse Communication: Mobility status  Activity Tolerance: Patient tolerated treatment well Patient left: in bed;with call bell/phone within reach;with family/visitor present  OT Visit Diagnosis: Unsteadiness on feet (R26.81)                Time: 6578-4696 OT Time Calculation (min): 21 min Charges:  OT General Charges $OT Visit: 1 Visit OT Evaluation $OT Eval Moderate Complexity: 1 Mod  04/03/2023  RP, OTR/L  Acute Rehabilitation Services  Office:  [phone]   Suzanna Obey 04/03/2023, 3:56 PM

## 2023-04-03 NOTE — Progress Notes (Signed)
Progress Note     Subjective: Pt reports she had a bad headache yesterday but slept well overnight. She lives at home with her husband of 60 years and is independent at baseline. She denies pain other than right face/head. Denies nausea or vomiting this AM.   Objective: Vital signs in last 24 hours: Temp:  [98.2 F (36.8 C)-99 F (37.2 C)] 98.9 F (37.2 C) (07/01 0539) Pulse Rate:  [58-72] 58 (07/01 0630) Resp:  [12-22] 12 (07/01 0630) BP: (103-145)/(57-100) 103/61 (07/01 0630) SpO2:  [93 %-100 %] 99 % (07/01 0630) Weight:  [68 kg] 68 kg (06/30 1742)    Intake/Output from previous day: 06/30 0701 - 07/01 0700 In: 100 [IV Piggyback:100] Out: -  Intake/Output this shift: No intake/output data recorded.  PE: General: pleasant, WD, WN female who is laying in bed in NAD HEENT: ecchymosis and mild edema of right face, laceration C/D/I with sutures present, right scleral hemorrhage, EOMI  Heart: regular, rate, and rhythm.  Normal s1,s2. No obvious murmurs, gallops, or rubs noted.  Palpable radial and pedal pulses bilaterally Lungs: CTAB, no wheezes, rhonchi, or rales noted.  Respiratory effort nonlabored Abd: soft, NT, ND, +BS, no masses, hernias, or organomegaly MS: all 4 extremities are symmetrical with no cyanosis, clubbing, or edema. Skin: warm and dry with no masses, lesions, or rashes Neuro: Cranial nerves 2-12 grossly intact, sensation is normal throughout Psych: A&Ox4 with an appropriate affect.    Lab Results:  Recent Labs    04/02/23 1739 04/02/23 1743 04/03/23 0331  WBC 6.6  --  9.0  HGB 11.0* 10.5* 11.8*  HCT 33.9* 31.0* 36.5  PLT 165  --  185   BMET Recent Labs    04/02/23 1739 04/02/23 1743 04/03/23 0331  NA 139 142 139  K 3.8 3.7 3.6  CL 107 105 105  CO2 23  --  26  GLUCOSE 133* 130* 138*  BUN 16 17 12   CREATININE 0.65 0.70 0.54  CALCIUM 8.2*  --  8.5*   PT/INR Recent Labs    04/02/23 1739  LABPROT 14.3  INR 1.1   CMP     Component  Value Date/Time   NA 139 04/03/2023 0331   NA 141 02/06/2023 1032   K 3.6 04/03/2023 0331   CL 105 04/03/2023 0331   CO2 26 04/03/2023 0331   GLUCOSE 138 (H) 04/03/2023 0331   BUN 12 04/03/2023 0331   BUN 13 02/06/2023 1032   CREATININE 0.54 04/03/2023 0331   CALCIUM 8.5 (L) 04/03/2023 0331   PROT 5.4 (L) 04/02/2023 1739   ALBUMIN 3.2 (L) 04/02/2023 1739   AST 18 04/02/2023 1739   ALT 9 04/02/2023 1739   ALKPHOS 59 04/02/2023 1739   BILITOT 0.5 04/02/2023 1739   GFRNONAA >60 04/03/2023 0331   Lipase  No results found for: "LIPASE"     Studies/Results: CT HEAD WO CONTRAST ( )  Result Date: 04/03/2023 CLINICAL DATA:  77 year old female status post fall with intracranial hemorrhage. Right orbit and maxillary sinus fractures. EXAM: CT HEAD WITHOUT CONTRAST TECHNIQUE: Contiguous axial images were obtained from the base of the skull through the vertex without intravenous contrast. RADIATION DOSE REDUCTION: This exam was performed according to the departmental dose-optimization program which includes automated exposure control, adjustment of the mA and/or kV according to patient size and/or use of iterative reconstruction technique. COMPARISON:  Head CT 04/02/2023. FINDINGS: Brain: 2 anterior right frontal lobe white matter shear hemorrhages are stable, the larger is 11 mm on  series 3, image 24. However, additional superior frontal gyrus shear hemorrhages near the vertex are larger on series 3, image 28, now up to 13 mm individually. Mild white matter edema in these areas with no significant mass effect. And there is probably a small right superior frontal lobe hemorrhagic cerebral contusion on series 6, image 24, stable. Adjacent subarachnoid blood. Small superimposed posterior right temporal lobe shear hemorrhage or hemorrhagic contusion is stable and subcentimeter on series 6, image 11. No other superficial cerebral hemorrhagic contusion on either side. A small volume of bilateral  subarachnoid hemorrhage has regressed but not resolved. Blood is still visible at both posterior sylvian fissures, parietal sulci. No IVH or ventriculomegaly. No midline shift. Basilar cisterns are patent. Trace mostly isodense left vertex subdural hematoma is stable on series 5, image 29. No convincing right side subdural. Vascular: No suspicious intracranial vascular hyperdensity. Calcified atherosclerosis at the skull base. Skull: Face and orbit fractures as demonstrated by face CT yesterday. No new osseous injury identified. Sinuses/Orbits: Layering hemorrhage in the right maxillary sinus. Other Visualized paranasal sinuses and mastoids are stable and better aerated. Other: Broad-based right scalp and periorbital soft tissue injury. Right globe appears intact. IMPRESSION: 1. Multiple right frontal lobe shear hemorrhages, some mildly larger since yesterday (individually up to 1.3 cm). Small adjacent superficial hemorrhagic contusion is stable. And subcentimeter right temporal lobe shear hemorrhage or hemorrhagic contusion is stable. Mild regional edema and no significant mass effect. 2. Trace left subdural hematoma is stable. Decreased but not resolved small bilateral subarachnoid hemorrhage. 3. Facial fractures detailed yesterday. No calvarium fracture identified. No IVH, ventriculomegaly, or midline shift. Electronically Signed   By: Odessa Fleming M.D.   On: 04/03/2023 05:29   CT HEAD WO CONTRAST  Result Date: 04/02/2023 CLINICAL DATA:  Head trauma, moderate-severe.  Fall. EXAM: CT HEAD WITHOUT CONTRAST TECHNIQUE: Contiguous axial images were obtained from the base of the skull through the vertex without intravenous contrast. RADIATION DOSE REDUCTION: This exam was performed according to the departmental dose-optimization program which includes automated exposure control, adjustment of the mA and/or kV according to patient size and/or use of iterative reconstruction technique. COMPARISON:  Head CT 12/01/2021  FINDINGS: Brain: There is scattered acute small to moderate volume subarachnoid hemorrhage supratentorially, right greater than left and with the greatest amount of hemorrhage being in the posterior right sylvian fissure. Several small acute parenchymal hemorrhages are present in the subcortical white matter of the right frontal lobe and measure up to 1.1 cm. There is also a 3 mm subcortical parenchymal hemorrhage in the lateral right temporal lobe. A small subdural hematoma over the superior left cerebral convexity measures 3 mm in thickness. There is also minimal extra-axial hemorrhage along the falx. No midline shift or acute infarct is evident. Mild cerebral atrophy is within normal limits for age. Vascular: Calcified atherosclerosis at the skull base. Skull: No acute calvarial fracture. See separately reported maxillofacial CT. Sinuses/Orbits: See separately reported maxillofacial CT. Other: Right frontal scalp and periorbital hematomas. Critical Value/emergent results were called by telephone at the time of interpretation on 04/02/2023 at 6:58 pm to Dr. Alvino Blood, who verbally acknowledged these results. IMPRESSION: Acute traumatic intracranial hemorrhage including small to moderate volume subarachnoid hemorrhage, small subcortical parenchymal hemorrhages in the right frontal and right occipital lobes, and a small subdural hematoma over the left cerebral convexity. No intracranial mass effect. Electronically Signed   By: Sebastian Ache M.D.   On: 04/02/2023 18:58   CT Maxillofacial Wo Contrast  Result  Date: 04/02/2023 CLINICAL DATA:  Facial trauma EXAM: CT MAXILLOFACIAL WITHOUT CONTRAST TECHNIQUE: Multidetector CT imaging of the maxillofacial structures was performed. Multiplanar CT image reconstructions were also generated. RADIATION DOSE REDUCTION: This exam was performed according to the departmental dose-optimization program which includes automated exposure control, adjustment of the mA and/or kV  according to patient size and/or use of iterative reconstruction technique. COMPARISON:  CT 12/01/2021 FINDINGS: Osseous: Trace fluid in the inferior right mastoid air cells. Mandibular heads are normally position. No mandibular fracture. Pterygoid plates and zygomatic arches appear intact. No acute nasal bone fracture. Orbits: Acute nondisplaced fracture floor of right orbit. Mild chronic deformity lateral wall right orbit. Small focus of intraorbital gas in the inferior right orbit. Sinuses: Moderate right hemosinus. Mild opacification of ethmoid air cells. Acute nondisplaced fractures involving the anterior and posterolateral walls of right maxillary sinus. Old left maxillary sinus and orbital floor fractures. Soft tissues: Moderate right periorbital hematoma with laceration. Limited intracranial: Small foci of intraparenchymal hemorrhage on the right. IMPRESSION: 1. Acute nondisplaced fractures involving the floor of right orbit, anterior and posterolateral walls of right maxillary sinus. Moderate right hemosinus. Small focus of intraorbital gas in the inferior right orbit. 2. Old left orbital and maxillary sinus fractures 3. Moderate right periorbital hematoma with laceration. 4. Acute foci of intraparenchymal hemorrhage within the right frontal lobe, see separately reported head CT. Electronically Signed   By: Jasmine Pang M.D.   On: 04/02/2023 18:50   CT CERVICAL SPINE WO CONTRAST  Result Date: 04/02/2023 CLINICAL DATA:  Mechanical fall EXAM: CT CERVICAL SPINE WITHOUT CONTRAST TECHNIQUE: Multidetector CT imaging of the cervical spine was performed without intravenous contrast. Multiplanar CT image reconstructions were also generated. RADIATION DOSE REDUCTION: This exam was performed according to the departmental dose-optimization program which includes automated exposure control, adjustment of the mA and/or kV according to patient size and/or use of iterative reconstruction technique. COMPARISON:  None  Available. FINDINGS: Alignment: No subluxation.  Facet alignment within normal limits. Skull base and vertebrae: No acute fracture. No primary bone lesion or focal pathologic process. Soft tissues and spinal canal: No prevertebral fluid or swelling. No visible canal hematoma. Disc levels: Multilevel degenerative change. Moderate severe disc space narrowing C5-C6 with bilateral foraminal narrowing Upper chest: Negative. Other: None IMPRESSION: No CT evidence for acute osseous abnormality. Electronically Signed   By: Jasmine Pang M.D.   On: 04/02/2023 18:41   DG Chest Port 1 View  Result Date: 04/02/2023 CLINICAL DATA:  Trauma EXAM: PORTABLE CHEST 1 VIEW COMPARISON:  Chest x-ray December 01, 2021 FINDINGS: The cardiomediastinal silhouette is unchanged in contour. No focal pulmonary opacity. No pleural effusion or pneumothorax. The visualized upper abdomen is unremarkable. No acute osseous abnormality. IMPRESSION: No acute cardiopulmonary abnormality. Electronically Signed   By: Jacob Moores M.D.   On: 04/02/2023 18:11    Anti-infectives: Anti-infectives (From admission, onward)    Start     Dose/Rate Route Frequency Ordered Stop   04/02/23 2000  Ampicillin-Sulbactam (UNASYN) 3 g in sodium chloride 0.9 % 100 mL IVPB        3 g 200 mL/hr over 30 Minutes Intravenous Every 6 hours 04/02/23 1925     04/02/23 1900  Ampicillin-Sulbactam (UNASYN) 3 g in sodium chloride 0.9 % 100 mL IVPB        3 g 200 mL/hr over 30 Minutes Intravenous  Once 04/02/23 1858 04/02/23 1918        Assessment/Plan  GLF  TBI - SAH/SDH/small intraparenchymal hemorrhages -  follow up CTH this AM with some evolution of injury but no significant mass effect, Dr. Jake Samples following, continue Keppra Right orbital floor and maxillary sinus fractures - Dr. Ulice Bold consulted, awaiting final recs, ice prn Right brow laceration - closed by EDP with absorbable suture  FEN: NPO, SLIV VTE: SQH possibly tomorrow if ok with NS ID:  [email or web address] for facial fxs  Dispo: 4NP, TBI therapies. Await full consult from NS and final recs from plastics regarding facial fractures  LOS: 1 day   I reviewed ED provider notes, Consultant NS notes, last 24 h vitals and pain scores, last 48 h intake and output, last 24 h labs and trends, and last 24 h imaging results    Juliet Rude, [facility] Surgery 04/03/2023, 8:16 AM Please see Amion for pager number during day hours 7:00am-4:30pm

## 2023-04-03 NOTE — Consult Note (Signed)
   Providing Compassionate, Quality Care - Together  Neurosurgery Consult  Referring physician: Trauma Reason for referral: Traumatic subarachnoid hemorrhage, contusions  Chief Complaint: Fall  History of Present Illness: This is a 77 year old female, who presented as a level 2 trauma due to tripping and falling onto her head yesterday.  She is amnestic to the event.  She denies any seizure-like activity, her husband and son are at bedside.  She current complains of a small headache but did sleep well last night.  She denies any numbness tingling or weakness.  She denies any vision changes.  She denies any bowel or bladder incontinence.  She is independent at baseline.   Medications: I have reviewed the patient's current medications. Allergies: No Known Allergies  History reviewed. No pertinent family history. Social History:  has no history on file for tobacco use, alcohol use, and drug use.  ROS: All pertinent positives and negatives are listed in HPI above  Physical Exam:  Vital signs in last 24 hours: Temp:  [98 F (36.7 C)-98.3 F (36.8 C)] 98 F (36.7 C) (07/25 1814) Pulse Rate:  [58-128] 65 (07/26 0746) Resp:  [11-18] 14 (07/26 0217) BP: (138-182)/(65-125) 153/88 (07/26 0700) SpO2:  [91 %-98 %] 96 % (07/26 0746) PE: Awake alert oriented x 3 PERRLA Periorbital ecchymoses Cranial nerves II through XII intact Speech fluent appropriate Face symmetric Bilateral upper/lower extremities full strength throughout No drift GCS 15   Impression/Assessment:  77 year old female with  Traumatic subarachnoid hemorrhage, small contusion and subdural hematoma without mass effect  -Repeat CT reviewed, no significant progression   Plan:  -Keppra x 7 days -Hold aspirin x 14 days -Can follow-up as an outpatient as needed, no acute neurosurgical intervention recommended -TBI/PT/OT therapies -Okay for DVT prophylaxis  Thank you for allowing me to participate in this patient's  care.  Please do not hesitate to call with questions or concerns.   Monia Pouch, DO Neurosurgeon [facility] Cell: [phone]

## 2023-04-03 NOTE — Evaluation (Signed)
Speech Language Pathology Evaluation Patient Details Name: Cheryl Harrington MRN: [id number] DOB: Nov 29, 1945 Today's Date: 04/03/2023 Time: 1000-1012 SLP Time Calculation (min) (ACUTE ONLY): 12 min  Problem List:  Patient Active Problem List   Diagnosis Date Noted   Fall from standing 04/02/2023   Osteoporosis 02/01/2023   GERD (gastroesophageal reflux disease) 02/01/2023   Elevated cholesterol 02/01/2023   Basal cell carcinoma of skin 12/14/2022   Impaired mobility and ADLs 12/14/2022   Menopause present 12/14/2022   Osteopenia 12/14/2022   Urinary incontinence 12/14/2022   Closed fracture of left orbital floor with routine healing 12/09/2021   Closed fracture of maxillary sinus (HCC) 12/09/2021   Fall 12/02/2021   Heel cord tightness, right 01/14/2020   Plantar fasciitis 01/14/2020   Past Medical History:  Past Medical History:  Diagnosis Date   Basal cell carcinoma of skin 12/14/2022   Had basal cell carcinoma excised from right labia in12/2014 and then further treated with additional excision and cryo 10/2012 per Dr. Tenny Craw note. Path report indicated positive peripheral and deep edges. Colpo for completeness 12/2015 - no bx needed. Per pt and confirmed with exam - area of prior biopsy R anterior labia. No residual skin changes noted.   Closed fracture of left orbital floor with routine healing 12/09/2021   Closed fracture of maxillary sinus (HCC) 12/09/2021   Elevated cholesterol    Fall 12/02/2021   GERD (gastroesophageal reflux disease)    Heel cord tightness, right 01/14/2020   Impaired mobility and ADLs 12/14/2022   Menopause present 12/14/2022   Osteopenia 12/14/2022   dx 2004- Actonel/Fosamax x 17yrs, followed by PCP, last dexa 2017 - per pt PCP has restarted her on bisphosphnate med   Osteoporosis    Plantar fasciitis 01/14/2020   Urinary incontinence 12/14/2022   occasional stress UI- discussed causes and reviewed mgmt options with pt 11/2015- declines intervention at  this time, still declining PT etc at 2018 visit.   Past Surgical History:  Past Surgical History:  Procedure Laterality Date   CATARACT EXTRACTION Bilateral 2007   CHOLECYSTECTOMY  2000   INGUINAL HERNIA REPAIR  1997   HPI:  This is a 77 year old female, who presented as a level 2 trauma due to tripping and falling onto her head.  She is amnestic to the event. SAH/SDH/small intraparenchymal hemorrhages - follow up CTH this AM with some evolution of injury but no significant mass effect, Right orbital floor and maxillary sinus fractures. Right brow laceration   Assessment / Plan / Recommendation Clinical Impression  Pt demonstrates relatively good function on cognitive screen. Mild difficulty with short term memory, which son reports is baseline. Pt appropriate and attentive able to engage well, sometimes needed repetition of instruction due to hearing. No SLP f/u recommended.    SLP Assessment  SLP Recommendation/Assessment: Patient does not need any further Speech Lanaguage Pathology Services    Recommendations for follow up therapy are one component of a multi-disciplinary discharge planning process, led by the attending physician.  Recommendations may be updated based on patient status, additional functional criteria and insurance authorization.    Follow Up Recommendations  No SLP follow up    Assistance Recommended at Discharge     Functional Status Assessment    Frequency and Duration           SLP Evaluation Cognition  Overall Cognitive Status: Within Functional Limits for tasks assessed Orientation Level: Oriented X4       Comprehension  Auditory Comprehension Overall Auditory Comprehension: Appears within  functional limits for tasks assessed    Expression Verbal Expression Overall Verbal Expression: Appears within functional limits for tasks assessed Written Expression Dominant Hand: Right   Oral / Motor  Oral Motor/Sensory Function Overall Oral Motor/Sensory  Function: Within functional limits            Cheryl Harrington, Cheryl Harrington 04/03/2023, 11:02 AM

## 2023-04-03 NOTE — ED Notes (Signed)
Patient transported to CT with RN 

## 2023-04-04 NOTE — Telephone Encounter (Signed)
Pharmacy Patient Advocate Encounter  Received notification from [employer]/ Rx Advance that Prior Authorization for Ondansetron 4MG  dispersible tablets  has been DENIED because  .Marland Kitchen

## 2023-04-11 DIAGNOSIS — S066X9A Traumatic subarachnoid hemorrhage with loss of consciousness of unspecified duration, initial encounter: Secondary | ICD-10-CM | POA: Diagnosis not present

## 2023-04-11 DIAGNOSIS — Z6825 Body mass index (BMI) 25.0-25.9, adult: Secondary | ICD-10-CM | POA: Diagnosis not present

## 2023-04-11 DIAGNOSIS — S0230XD Fracture of orbital floor, unspecified side, subsequent encounter for fracture with routine healing: Secondary | ICD-10-CM | POA: Diagnosis not present

## 2023-04-13 DIAGNOSIS — H40013 Open angle with borderline findings, low risk, bilateral: Secondary | ICD-10-CM | POA: Diagnosis not present

## 2023-04-24 DIAGNOSIS — M810 Age-related osteoporosis without current pathological fracture: Secondary | ICD-10-CM | POA: Diagnosis not present

## 2023-05-10 NOTE — Progress Notes (Deleted)
Cardiology Office Note:    Date:  05/10/2023   ID:  Cheryl Harrington, DOB 03/02/1946, MRN [id number]  PCP:  Marylen Ponto, MD  Cardiologist:  Norman Herrlich, MD    Referring MD: Marylen Ponto, MD    ASSESSMENT:    1. Coronary artery disease of native artery of native heart with stable angina pectoris (HCC)   2. Agatston coronary artery calcium score less than 100   3. Left bundle branch block (LBBB)   4. Elevated cholesterol    PLAN:    In order of problems listed above:  ***   Next appointment: ***   Medication Adjustments/Labs and Tests Ordered: Current medicines are reviewed at length with the patient today.  Concerns regarding medicines are outlined above.  No orders of the defined types were placed in this encounter.  No orders of the defined types were placed in this encounter.    History of Present Illness:    Cheryl Harrington is a 77 y.o. female with a hx of left bundle branch block short PR interval and hyperlipidemia last seen 02/03/2023 for chest pain.  Was seen [facility], ED 04/02/2023 at the fall head trauma with secondary subarachnoid hemorrhage right frontal parenchymal hemorrhage and admitted to the hospital and discharge 04/03/2023.    Cardiac CTA performed 02/10/2023 showed a calcium score mid range 70/48th percentile and minimal CAD in the LAD and right coronary artery less than 25% stenosis. Compliance with diet, lifestyle and medications: *** Past Medical History:  Diagnosis Date   Basal cell carcinoma of skin 12/14/2022   Had basal cell carcinoma excised from right labia in12/2014 and then further treated with additional excision and cryo 10/2012 per Dr. Tenny Craw note. Path report indicated positive peripheral and deep edges. Colpo for completeness 12/2015 - no bx needed. Per pt and confirmed with exam - area of prior biopsy R anterior labia. No residual skin changes noted.   Closed fracture of left orbital floor with routine healing 12/09/2021    Closed fracture of maxillary sinus (HCC) 12/09/2021   Elevated cholesterol    Fall 12/02/2021   GERD (gastroesophageal reflux disease)    Heel cord tightness, right 01/14/2020   Impaired mobility and ADLs 12/14/2022   Menopause present 12/14/2022   Osteopenia 12/14/2022   dx 2004- Actonel/Fosamax x 24yrs, followed by PCP, last dexa 2017 - per pt PCP has restarted her on bisphosphnate med   Osteoporosis    Plantar fasciitis 01/14/2020   Urinary incontinence 12/14/2022   occasional stress UI- discussed causes and reviewed mgmt options with pt 11/2015- declines intervention at this time, still declining PT etc at 2018 visit.    Current Medications: No outpatient medications have been marked as taking for the 05/15/23 encounter (Appointment) with Baldo Daub, MD.      EKGs/Labs/Other Studies Reviewed:    The following studies were reviewed today:  Cardiac Studies & Procedures          CT SCANS  CT CORONARY MORPH W/CTA COR W/SCORE 02/10/2023  Addendum 02/16/2023 12:45 PM ADDENDUM REPORT: 02/16/2023 12:43  EXAM: OVER-READ INTERPRETATION  CT CHEST  The following report is an over-read performed by radiologist Dr. Cicero Duck [facility] on 02/16/2023. This over-read does not include interpretation of cardiac or coronary anatomy or pathology. The cardiovascular interpretation by the cardiologist is attached.  COMPARISON:  None.  FINDINGS: No mediastinal or hilar masses. No enlarged lymph nodes. Airways are patent. Visualized esophagus is unremarkable.  Mild linear lung  opacities, mostly in the dependent lower lobes, consistent subsegmental atelectasis/scarring. Lungs otherwise clear. No pleural effusion or pneumothorax.  Limited upper abdomen visualization shows a small low-attenuation liver lesion, dome of segment 7, 7 mm, consistent with a cyst.  No fracture or acute finding.  No bone lesion.  No chest wall mass.  IMPRESSION: No acute or significant  extracardiac abnormality.   Electronically Signed By: Amie Portland M.D. On: 02/16/2023 12:43  Narrative CLINICAL DATA:  Chest pain  EXAM: Cardiac/Coronary CTA  TECHNIQUE: A non-contrast, gated CT scan was obtained with axial slices of 3 mm through the heart for calcium scoring. Calcium scoring was performed using the Agatston method. A 100 kV prospective, gated, contrast cardiac scan was obtained. Gantry rotation speed was 250 msecs and collimation was 0.6 mm. Two sublingual nitroglycerin tablets (0.8 mg) were given. The 3D data set was reconstructed in 5% intervals of the 35-75% of the R-R cycle. Diastolic phases were analyzed on a dedicated workstation using MPR, MIP, and VRT modes. The patient received 95 cc of contrast.  FINDINGS: Image quality: Excellent.  Noise artifact is: Limited.  Coronary Arteries:  Normal coronary origin.  Right dominance.  Left main: The left main is a large caliber vessel with a normal take off from the left coronary cusp that trifurcates into a LAD, LCX, and ramus intermedius. There is no plaque or stenosis.  Left anterior descending artery: The mid LAD contains minimal mixed density plaque (<25%). The LAD gives off 2 patent diagonal branches.  Ramus intermedius: Patent with no evidence of plaque or stenosis.  Left circumflex artery: The LCX is non-dominant and patent with no evidence of plaque or stenosis. The LCX gives off 2 patent obtuse marginal branches.  Right coronary artery: The RCA is dominant with normal take off from the right coronary cusp. There is minimal non-calcified plaque (<25%). The RCA terminates as a PDA without evidence of plaque or stenosis.  Right Atrium: Right atrial size is within normal limits.  Right Ventricle: The right ventricular cavity is within normal limits.  Left Atrium: Left atrial size is normal in size with no left atrial appendage filling defect.  Left Ventricle: The ventricular cavity size  is within normal limits.  Pulmonary arteries: Normal in size.  Pulmonary veins: Normal pulmonary venous drainage.  Pericardium: Normal thickness without significant effusion or calcium present.  Cardiac valves: The aortic valve is trileaflet without significant calcification. The mitral valve is normal without significant calcification.  Aorta: Normal caliber without significant disease.  Extra-cardiac findings: See attached radiology report for non-cardiac structures.  IMPRESSION: 1. Coronary calcium score of 74. This was 48th percentile for age-, sex, and race-matched controls.  2. Total plaque volume 69 mm3 which is 15th percentile for age- and sex-matched controls (calcified plaque 10 mm3; non-calcified plaque 69 mm3). TPV is mild.  3. Normal coronary origin with right dominance.  4. Minimal CAD in the LAD and RCA (<25%).  RECOMMENDATIONS: 1. Minimal non-obstructive CAD (0-24%). Consider non-atherosclerotic causes of chest pain. Consider preventive therapy and risk factor modification.  Lennie Odor, MD  Electronically Signed: By: Lennie Odor M.D. On: 02/10/2023 16:53              Recent Labs: 04/02/2023: ALT 9 04/03/2023: BUN 12; Creatinine, Ser 0.54; Hemoglobin 11.8; Platelets 185; Potassium 3.6; Sodium 139  Recent Lipid Panel No results found for: "CHOL", "TRIG", "HDL", "CHOLHDL", "VLDL", "LDLCALC", "LDLDIRECT"  Physical Exam:    VS:  There were no vitals taken for this  visit.    Wt Readings from Last 3 Encounters:  04/02/23 150 lb (68 kg)  02/03/23 153 lb 3.2 oz (69.5 kg)  12/01/22 152 lb (68.9 kg)     GEN: *** Well nourished, well developed in no acute distress HEENT: Normal NECK: No JVD; No carotid bruits LYMPHATICS: No lymphadenopathy CARDIAC: ***RRR, no murmurs, rubs, gallops RESPIRATORY:  Clear to auscultation without rales, wheezing or rhonchi  ABDOMEN: Soft, non-tender, non-distended MUSCULOSKELETAL:  No edema; No deformity  SKIN:  Warm and dry NEUROLOGIC:  Alert and oriented x 3 PSYCHIATRIC:  Normal affect    Signed, Norman Herrlich, MD  05/10/2023 11:00 AM    [facility]

## 2023-05-15 ENCOUNTER — Ambulatory Visit: Payer: PPO | Admitting: Cardiology

## 2023-05-15 DIAGNOSIS — E7800 Pure hypercholesterolemia, unspecified: Secondary | ICD-10-CM

## 2023-05-15 DIAGNOSIS — R931 Abnormal findings on diagnostic imaging of heart and coronary circulation: Secondary | ICD-10-CM

## 2023-05-15 DIAGNOSIS — I447 Left bundle-branch block, unspecified: Secondary | ICD-10-CM

## 2023-05-15 DIAGNOSIS — I25118 Atherosclerotic heart disease of native coronary artery with other forms of angina pectoris: Secondary | ICD-10-CM

## 2023-07-08 NOTE — Progress Notes (Unsigned)
Cardiology Office Note:    Date:  07/10/2023   ID:  Cheryl Harrington, DOB 09-10-1946, MRN [id number]  PCP:  Marylen Ponto, MD  Cardiologist:  Norman Herrlich, MD    Referring MD: Marylen Ponto, MD    ASSESSMENT:    1. Agatston coronary artery calcium score less than 100   2. Mild CAD    PLAN:    In order of problems listed above:  She is on appropriate medical therapy for mild nonobstructive CAD continue aspirin or high intensity statin nitroglycerin if needed If having frequent chest pain we can add additional medications I think I would start with a long-acting nitrate   Next appointment: I will plan to see her back in my office as needed   Medication Adjustments/Labs and Tests Ordered: Current medicines are reviewed at length with the patient today.  Concerns regarding medicines are outlined above.  No orders of the defined types were placed in this encounter.  No orders of the defined types were placed in this encounter.    History of Present Illness:    Cheryl Harrington is a 76 y.o. female with a hx of left bundle branch block hyperlipidemia exertional shortness of breath with concern of anginal equivalent last seen 02/03/2023.  Following that visit she had a cardiac CTA reported 02/10/2023 with a coronary calcium score of 74 48th percentile plaque volume 15th percentile and minimal CAD in the LAD and right coronary nonobstructive less than 25%.  She had a fall with head trauma and subarachnoid hemorrhage June of this year treated conservatively associated with right orbital floor and maxillary sinus fractures.  Compliance with diet, lifestyle and medications: Yes  She is made a full recovery from her traumatic subarachnoid hemorrhage She has had no further anginal discomfort With reviewed her coronary CTA with minimal nonobstructive CAD she is on good foundational medical therapy with aspirin and high intensity statin and she has a prescription for  nitroglycerin that her and her husband share if needed.  Her blood pressure is ideal 110/70 heart rate 56/min I do not think she requires any additional antianginal or beta-blocker treatment. She is having no cardiovascular symptoms of edema shortness of breath, potation or syncope Past Medical History:  Diagnosis Date   Basal cell carcinoma of skin 12/14/2022   Had basal cell carcinoma excised from right labia in12/2014 and then further treated with additional excision and cryo 10/2012 per Dr. Tenny Craw note. Path report indicated positive peripheral and deep edges. Colpo for completeness 12/2015 - no bx needed. Per pt and confirmed with exam - area of prior biopsy R anterior labia. No residual skin changes noted.   Closed fracture of left orbital floor with routine healing 12/09/2021   Closed fracture of maxillary sinus (HCC) 12/09/2021   Elevated cholesterol    Fall 12/02/2021   GERD (gastroesophageal reflux disease)    Heel cord tightness, right 01/14/2020   Impaired mobility and ADLs 12/14/2022   Menopause present 12/14/2022   Osteopenia 12/14/2022   dx 2004- Actonel/Fosamax x 51yrs, followed by PCP, last dexa 2017 - per pt PCP has restarted her on bisphosphnate med   Osteoporosis    Plantar fasciitis 01/14/2020   Urinary incontinence 12/14/2022   occasional stress UI- discussed causes and reviewed mgmt options with pt 11/2015- declines intervention at this time, still declining PT etc at 2018 visit.    Current Medications: Current Meds  Medication Sig   acetaminophen (TYLENOL) 325 MG tablet Take 325 mg by  mouth every 6 (six) hours as needed for mild pain or moderate pain.   ascorbic acid (VITAMIN C) 500 MG tablet Take 500 mg by mouth daily.   aspirin EC 81 MG tablet Take 1 tablet (81 mg total) by mouth daily. Swallow whole.   calcium carbonate (SUPER CALCIUM) 1500 (600 Ca) MG TABS tablet Take 2 tablets by mouth daily.   citalopram (CELEXA) 10 MG tablet Take 10 mg by mouth daily.    denosumab (PROLIA) 60 MG/ML SOSY injection Inject 60 mg into the skin every 6 (six) months.   finasteride (PROSCAR) 5 MG tablet Take 2.5 mg by mouth daily.   Multiple Vitamin (MULTI-VITAMIN) tablet Take 1 tablet by mouth daily.   omeprazole (PRILOSEC) 40 MG capsule Take 40 mg by mouth daily.   prednisoLONE acetate (PRED FORTE) 1 % ophthalmic suspension Place 1 drop into both eyes 2 (two) times daily.   rosuvastatin (CRESTOR) 10 MG tablet Take 10 mg by mouth daily.      EKGs/Labs/Other Studies Reviewed:    The following studies were reviewed today:  Cardiac Studies & Procedures          CT SCANS  CT CORONARY MORPH W/CTA COR W/SCORE 02/10/2023  Addendum 02/16/2023 12:45 PM ADDENDUM REPORT: 02/16/2023 12:43  EXAM: OVER-READ INTERPRETATION  CT CHEST  The following report is an over-read performed by radiologist Dr. Cicero Duck [facility] on 02/16/2023. This over-read does not include interpretation of cardiac or coronary anatomy or pathology. The cardiovascular interpretation by the cardiologist is attached.  COMPARISON:  None.  FINDINGS: No mediastinal or hilar masses. No enlarged lymph nodes. Airways are patent. Visualized esophagus is unremarkable.  Mild linear lung opacities, mostly in the dependent lower lobes, consistent subsegmental atelectasis/scarring. Lungs otherwise clear. No pleural effusion or pneumothorax.  Limited upper abdomen visualization shows a small low-attenuation liver lesion, dome of segment 7, 7 mm, consistent with a cyst.  No fracture or acute finding.  No bone lesion.  No chest wall mass.  IMPRESSION: No acute or significant extracardiac abnormality.   Electronically Signed By: Amie Portland M.D. On: 02/16/2023 12:43  Narrative CLINICAL DATA:  Chest pain  EXAM: Cardiac/Coronary CTA  TECHNIQUE: A non-contrast, gated CT scan was obtained with axial slices of 3 mm through the heart for calcium scoring. Calcium scoring  was performed using the Agatston method. A 100 kV prospective, gated, contrast cardiac scan was obtained. Gantry rotation speed was 250 msecs and collimation was 0.6 mm. Two sublingual nitroglycerin tablets (0.8 mg) were given. The 3D data set was reconstructed in 5% intervals of the 35-75% of the R-R cycle. Diastolic phases were analyzed on a dedicated workstation using MPR, MIP, and VRT modes. The patient received 95 cc of contrast.  FINDINGS: Image quality: Excellent.  Noise artifact is: Limited.  Coronary Arteries:  Normal coronary origin.  Right dominance.  Left main: The left main is a large caliber vessel with a normal take off from the left coronary cusp that trifurcates into a LAD, LCX, and ramus intermedius. There is no plaque or stenosis.  Left anterior descending artery: The mid LAD contains minimal mixed density plaque (<25%). The LAD gives off 2 patent diagonal branches.  Ramus intermedius: Patent with no evidence of plaque or stenosis.  Left circumflex artery: The LCX is non-dominant and patent with no evidence of plaque or stenosis. The LCX gives off 2 patent obtuse marginal branches.  Right coronary artery: The RCA is dominant with normal take off from the right  coronary cusp. There is minimal non-calcified plaque (<25%). The RCA terminates as a PDA without evidence of plaque or stenosis.  Right Atrium: Right atrial size is within normal limits.  Right Ventricle: The right ventricular cavity is within normal limits.  Left Atrium: Left atrial size is normal in size with no left atrial appendage filling defect.  Left Ventricle: The ventricular cavity size is within normal limits.  Pulmonary arteries: Normal in size.  Pulmonary veins: Normal pulmonary venous drainage.  Pericardium: Normal thickness without significant effusion or calcium present.  Cardiac valves: The aortic valve is trileaflet without significant calcification. The mitral valve is  normal without significant calcification.  Aorta: Normal caliber without significant disease.  Extra-cardiac findings: See attached radiology report for non-cardiac structures.  IMPRESSION: 1. Coronary calcium score of 74. This was 48th percentile for age-, sex, and race-matched controls.  2. Total plaque volume 69 mm3 which is 15th percentile for age- and sex-matched controls (calcified plaque 10 mm3; non-calcified plaque 69 mm3). TPV is mild.  3. Normal coronary origin with right dominance.  4. Minimal CAD in the LAD and RCA (<25%).  RECOMMENDATIONS: 1. Minimal non-obstructive CAD (0-24%). Consider non-atherosclerotic causes of chest pain. Consider preventive therapy and risk factor modification.  Lennie Odor, MD  Electronically Signed: By: Lennie Odor M.D. On: 02/10/2023 16:53              Recent Labs: 04/02/2023: ALT 9 04/03/2023: BUN 12; Creatinine, Ser 0.54; Hemoglobin 11.8; Platelets 185; Potassium 3.6; Sodium 139  Recent Lipid Panel 12/01/2022 cholesterol 1 4 triglycerides 87 HDL 62 non HDL cholesterol 85  Physical Exam:    VS:  BP 110/70 (BP Location: Left Arm, Patient Position: Sitting, Cuff Size: Normal)   Pulse (!) 56   Ht 5\' 4"  (1.626 m)   Wt 154 lb 12.8 oz (70.2 kg)   SpO2 100%   BMI 26.57 kg/m     Wt Readings from Last 3 Encounters:  07/10/23 154 lb 12.8 oz (70.2 kg)  04/02/23 150 lb (68 kg)  02/03/23 153 lb 3.2 oz (69.5 kg)     GEN:  Well nourished, well developed in no acute distress HEENT: Normal NECK: No JVD; No carotid bruits LYMPHATICS: No lymphadenopathy CARDIAC: RRR, no murmurs, rubs, gallops RESPIRATORY:  Clear to auscultation without rales, wheezing or rhonchi  ABDOMEN: Soft, non-tender, non-distended MUSCULOSKELETAL:  No edema; No deformity  SKIN: Warm and dry NEUROLOGIC:  Alert and oriented x 3 PSYCHIATRIC:  Normal affect    Signed, Norman Herrlich, MD  07/10/2023 12:59 PM    [facility]

## 2023-07-10 ENCOUNTER — Ambulatory Visit: Payer: PPO | Attending: Cardiology | Admitting: Cardiology

## 2023-07-10 ENCOUNTER — Encounter: Payer: Self-pay | Admitting: Cardiology

## 2023-07-10 VITALS — BP 110/70 | HR 56 | Ht 64.0 in | Wt 154.8 lb

## 2023-07-10 DIAGNOSIS — I2510 Atherosclerotic heart disease of native coronary artery without angina pectoris: Secondary | ICD-10-CM | POA: Diagnosis not present

## 2023-07-10 DIAGNOSIS — R931 Abnormal findings on diagnostic imaging of heart and coronary circulation: Secondary | ICD-10-CM

## 2023-07-10 NOTE — Patient Instructions (Signed)
Medication Instructions:  Your physician recommends that you continue on your current medications as directed. Please refer to the Current Medication list given to you today.  *If you need a refill on your cardiac medications before your next appointment, please call your pharmacy*   Lab Work: None If you have labs (blood work) drawn today and your tests are completely normal, you will receive your results only by: MyChart Message (if you have MyChart) OR A paper copy in the mail If you have any lab test that is abnormal or we need to change your treatment, we will call you to review the results.   Testing/Procedures: None   Follow-Up: At [facility], you and your health needs are our priority.  As part of our continuing mission to provide you with exceptional heart care, we have created designated Provider Care Teams.  These Care Teams include your primary Cardiologist (physician) and Advanced Practice Providers (APPs -  Physician Assistants and Nurse Practitioners) who all work together to provide you with the care you need, when you need it.  We recommend signing up for the patient portal called "MyChart".  Sign up information is provided on this After Visit Summary.  MyChart is used to connect with patients for Virtual Visits (Telemedicine).  Patients are able to view lab/test results, encounter notes, upcoming appointments, etc.  Non-urgent messages can be sent to your provider as well.   To learn more about what you can do with MyChart, go to [email or web address].    Your next appointment:   Follow up as needed  Provider:   Brian Munley, MD    Other Instructions None  

## 2023-07-11 DIAGNOSIS — H903 Sensorineural hearing loss, bilateral: Secondary | ICD-10-CM | POA: Diagnosis not present

## 2023-08-08 DIAGNOSIS — Z6826 Body mass index (BMI) 26.0-26.9, adult: Secondary | ICD-10-CM | POA: Diagnosis not present

## 2023-08-08 DIAGNOSIS — H9319 Tinnitus, unspecified ear: Secondary | ICD-10-CM | POA: Diagnosis not present

## 2023-08-23 DIAGNOSIS — H903 Sensorineural hearing loss, bilateral: Secondary | ICD-10-CM | POA: Diagnosis not present

## 2023-08-23 DIAGNOSIS — H8111 Benign paroxysmal vertigo, right ear: Secondary | ICD-10-CM | POA: Diagnosis not present

## 2023-08-23 DIAGNOSIS — H9313 Tinnitus, bilateral: Secondary | ICD-10-CM | POA: Diagnosis not present

## 2023-08-25 DIAGNOSIS — H8111 Benign paroxysmal vertigo, right ear: Secondary | ICD-10-CM | POA: Diagnosis not present

## 2023-09-06 DIAGNOSIS — H8111 Benign paroxysmal vertigo, right ear: Secondary | ICD-10-CM | POA: Diagnosis not present

## 2023-09-06 DIAGNOSIS — R42 Dizziness and giddiness: Secondary | ICD-10-CM | POA: Diagnosis not present

## 2023-09-14 DIAGNOSIS — D2239 Melanocytic nevi of other parts of face: Secondary | ICD-10-CM | POA: Diagnosis not present

## 2023-09-14 DIAGNOSIS — D225 Melanocytic nevi of trunk: Secondary | ICD-10-CM | POA: Diagnosis not present

## 2023-09-14 DIAGNOSIS — L821 Other seborrheic keratosis: Secondary | ICD-10-CM | POA: Diagnosis not present

## 2023-09-14 DIAGNOSIS — L814 Other melanin hyperpigmentation: Secondary | ICD-10-CM | POA: Diagnosis not present

## 2023-09-14 DIAGNOSIS — L820 Inflamed seborrheic keratosis: Secondary | ICD-10-CM | POA: Diagnosis not present

## 2023-10-17 DIAGNOSIS — Z1231 Encounter for screening mammogram for malignant neoplasm of breast: Secondary | ICD-10-CM | POA: Diagnosis not present

## 2023-10-31 DIAGNOSIS — M810 Age-related osteoporosis without current pathological fracture: Secondary | ICD-10-CM | POA: Diagnosis not present

## 2023-12-21 DIAGNOSIS — K219 Gastro-esophageal reflux disease without esophagitis: Secondary | ICD-10-CM | POA: Diagnosis not present

## 2023-12-21 DIAGNOSIS — M810 Age-related osteoporosis without current pathological fracture: Secondary | ICD-10-CM | POA: Diagnosis not present

## 2023-12-21 DIAGNOSIS — E7800 Pure hypercholesterolemia, unspecified: Secondary | ICD-10-CM | POA: Diagnosis not present

## 2023-12-21 DIAGNOSIS — Z1339 Encounter for screening examination for other mental health and behavioral disorders: Secondary | ICD-10-CM | POA: Diagnosis not present

## 2023-12-21 DIAGNOSIS — Z79899 Other long term (current) drug therapy: Secondary | ICD-10-CM | POA: Diagnosis not present

## 2023-12-21 DIAGNOSIS — Z658 Other specified problems related to psychosocial circumstances: Secondary | ICD-10-CM | POA: Diagnosis not present

## 2023-12-21 DIAGNOSIS — R42 Dizziness and giddiness: Secondary | ICD-10-CM | POA: Diagnosis not present

## 2023-12-21 DIAGNOSIS — Z1331 Encounter for screening for depression: Secondary | ICD-10-CM | POA: Diagnosis not present

## 2023-12-21 DIAGNOSIS — Z6826 Body mass index (BMI) 26.0-26.9, adult: Secondary | ICD-10-CM | POA: Diagnosis not present

## 2023-12-21 DIAGNOSIS — Z0000 Encounter for general adult medical examination without abnormal findings: Secondary | ICD-10-CM | POA: Diagnosis not present

## 2023-12-28 DIAGNOSIS — H81393 Other peripheral vertigo, bilateral: Secondary | ICD-10-CM | POA: Diagnosis not present

## 2023-12-28 DIAGNOSIS — H8111 Benign paroxysmal vertigo, right ear: Secondary | ICD-10-CM | POA: Diagnosis not present

## 2024-03-09 DIAGNOSIS — L820 Inflamed seborrheic keratosis: Secondary | ICD-10-CM | POA: Diagnosis not present

## 2024-03-14 DIAGNOSIS — H40013 Open angle with borderline findings, low risk, bilateral: Secondary | ICD-10-CM | POA: Diagnosis not present

## 2024-03-18 DIAGNOSIS — K3189 Other diseases of stomach and duodenum: Secondary | ICD-10-CM | POA: Diagnosis not present

## 2024-03-18 DIAGNOSIS — K219 Gastro-esophageal reflux disease without esophagitis: Secondary | ICD-10-CM | POA: Diagnosis not present

## 2024-03-18 DIAGNOSIS — R1310 Dysphagia, unspecified: Secondary | ICD-10-CM | POA: Diagnosis not present

## 2024-03-18 DIAGNOSIS — K644 Residual hemorrhoidal skin tags: Secondary | ICD-10-CM | POA: Diagnosis not present

## 2024-03-18 DIAGNOSIS — K5900 Constipation, unspecified: Secondary | ICD-10-CM | POA: Diagnosis not present

## 2024-05-01 DIAGNOSIS — K219 Gastro-esophageal reflux disease without esophagitis: Secondary | ICD-10-CM | POA: Diagnosis not present

## 2024-05-01 DIAGNOSIS — R1310 Dysphagia, unspecified: Secondary | ICD-10-CM | POA: Diagnosis not present

## 2024-05-01 DIAGNOSIS — K222 Esophageal obstruction: Secondary | ICD-10-CM | POA: Diagnosis not present

## 2024-05-01 DIAGNOSIS — K644 Residual hemorrhoidal skin tags: Secondary | ICD-10-CM | POA: Diagnosis not present

## 2024-05-01 DIAGNOSIS — Z800 Family history of malignant neoplasm of digestive organs: Secondary | ICD-10-CM | POA: Diagnosis not present

## 2024-08-06 DIAGNOSIS — M79676 Pain in unspecified toe(s): Secondary | ICD-10-CM | POA: Diagnosis not present

## 2024-08-06 DIAGNOSIS — L608 Other nail disorders: Secondary | ICD-10-CM | POA: Diagnosis not present

## 2024-08-21 DIAGNOSIS — L820 Inflamed seborrheic keratosis: Secondary | ICD-10-CM | POA: Diagnosis not present

## 2024-10-24 LAB — LAB REPORT - SCANNED

## 2024-10-25 ENCOUNTER — Other Ambulatory Visit: Payer: Self-pay | Admitting: Pharmacist

## 2024-10-28 ENCOUNTER — Other Ambulatory Visit (HOSPITAL_COMMUNITY): Payer: Self-pay | Admitting: Family Medicine

## 2024-10-28 NOTE — Progress Notes (Signed)
 Therapy plan placed - last dose of Prolia  was 10/31/2023 (overdue).  Calcium  on 10/24/2024 wnl - labs will be faxed by Annabella Galla, PharmD.  Sherry Pennant, PharmD, MPH, BCPS, CPP Clinical Pharmacist

## 2024-10-28 NOTE — Progress Notes (Signed)
 Received the referral for this patient's Prolia  for [place] [facility].  She appears to be continuation of treatment.  Need to determine when her last dose was completed and labs within 6 months of the Prolia  dose due date (calcium  level)  Routing to Tiffany Benfield  Sherry Pennant, PharmD, MPH, BCPS, CPP Clinical Pharmacist

## 2024-11-08 ENCOUNTER — Encounter: Payer: Self-pay | Admitting: Family Medicine

## 2024-11-08 ENCOUNTER — Telehealth: Payer: Self-pay

## 2024-11-08 NOTE — Telephone Encounter (Signed)
 Auth Submission: NO AUTH NEEDED Site of care: Site of care: [facility] Payer: [employer] Medication & CPT/J Code(s) submitted: Stoboclo (denosumab -bmwo) [id number] Diagnosis Code:  Route of submission (phone, fax, portal):  Phone # Fax # Auth type: Buy/Bill PB Units/visits requested: 60mg  x 2 doses Reference number:  Approval from: 11/08/24 to 10/02/25

## 2024-11-08 NOTE — Addendum Note (Signed)
 Addended by: DAYNE SHERRY RAMAN on: 11/08/2024 02:48 PM   Modules accepted: Orders
